# Patient Record
Sex: Female | Born: 1970 | Race: White | Hispanic: No | Marital: Married | Smoking: Never smoker
Health system: Southern US, Community
[De-identification: ages and names within clinical notes are randomized; demographics above are authoritative.]

## PROBLEM LIST (undated history)

## (undated) DIAGNOSIS — J45909 Unspecified asthma, uncomplicated: Secondary | ICD-10-CM

## (undated) DIAGNOSIS — N857 Hematometra: Secondary | ICD-10-CM

## (undated) DIAGNOSIS — T4145XA Adverse effect of unspecified anesthetic, initial encounter: Secondary | ICD-10-CM

## (undated) DIAGNOSIS — R112 Nausea with vomiting, unspecified: Secondary | ICD-10-CM

## (undated) DIAGNOSIS — K219 Gastro-esophageal reflux disease without esophagitis: Secondary | ICD-10-CM

## (undated) DIAGNOSIS — K2100 Gastro-esophageal reflux disease with esophagitis, without bleeding: Secondary | ICD-10-CM

## (undated) DIAGNOSIS — F419 Anxiety disorder, unspecified: Secondary | ICD-10-CM

## (undated) DIAGNOSIS — J189 Pneumonia, unspecified organism: Secondary | ICD-10-CM

## (undated) DIAGNOSIS — Z8709 Personal history of other diseases of the respiratory system: Secondary | ICD-10-CM

## (undated) DIAGNOSIS — D510 Vitamin B12 deficiency anemia due to intrinsic factor deficiency: Secondary | ICD-10-CM

## (undated) DIAGNOSIS — T8859XA Other complications of anesthesia, initial encounter: Secondary | ICD-10-CM

## (undated) HISTORY — PX: OOPHORECTOMY: SHX86

## (undated) HISTORY — PX: WISDOM TOOTH EXTRACTION: SHX21

## (undated) HISTORY — PX: ABDOMINAL HYSTERECTOMY: SHX81

---

## 2006-08-24 ENCOUNTER — Ambulatory Visit: Payer: Self-pay | Admitting: Unknown Physician Specialty

## 2007-01-15 ENCOUNTER — Ambulatory Visit: Payer: Self-pay | Admitting: Internal Medicine

## 2010-02-23 ENCOUNTER — Ambulatory Visit: Payer: Self-pay | Admitting: Gynecologic Oncology

## 2010-03-19 ENCOUNTER — Ambulatory Visit: Payer: Self-pay | Admitting: Gynecologic Oncology

## 2010-03-26 ENCOUNTER — Ambulatory Visit: Payer: Self-pay | Admitting: Gynecologic Oncology

## 2010-04-15 ENCOUNTER — Ambulatory Visit: Payer: Self-pay | Admitting: Gynecologic Oncology

## 2010-04-23 ENCOUNTER — Ambulatory Visit: Payer: Self-pay | Admitting: Gynecologic Oncology

## 2010-04-30 ENCOUNTER — Ambulatory Visit: Payer: Self-pay | Admitting: Gynecologic Oncology

## 2010-05-26 ENCOUNTER — Ambulatory Visit: Payer: Self-pay | Admitting: Gynecologic Oncology

## 2010-09-10 ENCOUNTER — Ambulatory Visit: Payer: Self-pay | Admitting: Internal Medicine

## 2011-05-27 HISTORY — PX: COLPOSCOPY: SHX161

## 2012-05-26 HISTORY — PX: CERVICAL CONE BIOPSY: SUR198

## 2013-08-04 ENCOUNTER — Ambulatory Visit: Payer: Self-pay | Admitting: Internal Medicine

## 2014-06-20 ENCOUNTER — Ambulatory Visit (INDEPENDENT_AMBULATORY_CARE_PROVIDER_SITE_OTHER): Payer: No Typology Code available for payment source

## 2014-06-20 ENCOUNTER — Encounter: Payer: Self-pay | Admitting: Podiatry

## 2014-06-20 ENCOUNTER — Ambulatory Visit (INDEPENDENT_AMBULATORY_CARE_PROVIDER_SITE_OTHER): Payer: No Typology Code available for payment source | Admitting: Podiatry

## 2014-06-20 VITALS — BP 137/98 | HR 87 | Resp 16 | Ht 69.0 in | Wt 215.0 lb

## 2014-06-20 DIAGNOSIS — L923 Foreign body granuloma of the skin and subcutaneous tissue: Secondary | ICD-10-CM

## 2014-06-20 DIAGNOSIS — M6740 Ganglion, unspecified site: Secondary | ICD-10-CM

## 2014-06-20 NOTE — Progress Notes (Signed)
   Subjective:    Patient ID: Julie Sharp, female    DOB: [birth year], 44 y.o.   MRN: [id number]  HPI Comments: i have a knot on the top of my rt big toe. Ive had it for 6 months and worse the last 3 weeks. Certain shoes will bother me. Seams of socks will bother it. Pressure will bother it. i have done nothing for my toe.   Foot Pain      Review of Systems  All other systems reviewed and are negative.      Objective:   Physical Exam        Assessment & Plan:

## 2014-06-21 NOTE — Progress Notes (Signed)
Subjective:     Patient ID: Julie Sharp, female   DOB: [birth year], 44 y.o.   MRN: [id number]  HPI patient presents stating I have this nodule on my right big toe that's been present for about 6 months and is becoming gradually more tender and makes it hard for me to wear certain shoes   Review of Systems  All other systems reviewed and are negative.      Objective:   Physical Exam  Constitutional: She is oriented to person, place, and time.  Cardiovascular: Intact distal pulses.   Musculoskeletal: Normal range of motion.  Neurological: She is oriented to person, place, and time.  Skin: Skin is warm.  Vitals reviewed.  neurovascular status intact with muscle strength adequate and range of motion within normal limits. Patient is noted to have a small nodule at the interphalangeal joint of the right big toe medial side that's painful when pressed and making shoe gear difficult. No other significant pathology with digits well perfused and patient well oriented 3     Assessment:     Interphalangeal joint cyst formation or possible small inflammatory capsulitis right side with possibility for bone spur    Plan:     H&P and x-ray reviewed. Did a careful injection underneath the small lesion 3 mg [place] some Kenalog 5 mg Xylocaine and applied padding with cushioned. If symptoms persist reappoint

## 2015-03-29 ENCOUNTER — Other Ambulatory Visit: Payer: Self-pay | Admitting: Unknown Physician Specialty

## 2015-03-29 ENCOUNTER — Inpatient Hospital Stay
Admission: RE | Admit: 2015-03-29 | Discharge: 2015-03-29 | Disposition: A | Discharge status: Home / Self Care | Payer: Self-pay | Source: Ambulatory Visit | Attending: *Deleted | Admitting: *Deleted

## 2015-03-29 ENCOUNTER — Other Ambulatory Visit: Payer: Self-pay | Admitting: *Deleted

## 2015-03-29 DIAGNOSIS — R928 Other abnormal and inconclusive findings on diagnostic imaging of breast: Secondary | ICD-10-CM

## 2015-03-29 DIAGNOSIS — Z9289 Personal history of other medical treatment: Secondary | ICD-10-CM

## 2015-03-30 ENCOUNTER — Ambulatory Visit
Admission: RE | Admit: 2015-03-30 | Discharge: 2015-03-30 | Disposition: A | Discharge status: Home / Self Care | Payer: 59 | Source: Ambulatory Visit | Attending: Unknown Physician Specialty | Admitting: Unknown Physician Specialty

## 2015-03-30 DIAGNOSIS — R928 Other abnormal and inconclusive findings on diagnostic imaging of breast: Secondary | ICD-10-CM

## 2016-06-10 DIAGNOSIS — J40 Bronchitis, not specified as acute or chronic: Secondary | ICD-10-CM | POA: Diagnosis not present

## 2016-07-15 ENCOUNTER — Other Ambulatory Visit: Payer: Self-pay | Admitting: Obstetrics and Gynecology

## 2016-07-15 DIAGNOSIS — Z1231 Encounter for screening mammogram for malignant neoplasm of breast: Secondary | ICD-10-CM

## 2016-08-04 ENCOUNTER — Encounter: Payer: Self-pay | Admitting: Obstetrics and Gynecology

## 2016-08-04 ENCOUNTER — Ambulatory Visit
Admission: RE | Admit: 2016-08-04 | Discharge: 2016-08-04 | Disposition: A | Discharge status: Home / Self Care | Payer: 59 | Source: Ambulatory Visit | Attending: Obstetrics and Gynecology | Admitting: Obstetrics and Gynecology

## 2016-08-04 ENCOUNTER — Ambulatory Visit: Admission: RE | Admit: 2016-08-04 | Payer: 59 | Source: Ambulatory Visit

## 2016-08-04 DIAGNOSIS — Z1231 Encounter for screening mammogram for malignant neoplasm of breast: Secondary | ICD-10-CM | POA: Insufficient documentation

## 2016-08-06 DIAGNOSIS — D230 Other benign neoplasm of skin of lip: Secondary | ICD-10-CM | POA: Diagnosis not present

## 2016-09-16 DIAGNOSIS — R5383 Other fatigue: Secondary | ICD-10-CM | POA: Diagnosis not present

## 2016-10-07 DIAGNOSIS — R5383 Other fatigue: Secondary | ICD-10-CM | POA: Diagnosis not present

## 2016-12-01 DIAGNOSIS — J029 Acute pharyngitis, unspecified: Secondary | ICD-10-CM | POA: Diagnosis not present

## 2017-01-15 ENCOUNTER — Other Ambulatory Visit: Payer: Self-pay | Admitting: Physician Assistant

## 2017-01-15 ENCOUNTER — Telehealth: Payer: Self-pay

## 2017-01-15 DIAGNOSIS — R1032 Left lower quadrant pain: Principal | ICD-10-CM

## 2017-01-15 DIAGNOSIS — R1031 Right lower quadrant pain: Secondary | ICD-10-CM

## 2017-01-15 DIAGNOSIS — R399 Unspecified symptoms and signs involving the genitourinary system: Secondary | ICD-10-CM | POA: Diagnosis not present

## 2017-01-15 DIAGNOSIS — R102 Pelvic and perineal pain: Secondary | ICD-10-CM | POA: Diagnosis not present

## 2017-01-15 NOTE — Telephone Encounter (Signed)
Called and schedule pain for 01/19/17 with Opal Sidles. Pt called back to cancel

## 2017-01-15 NOTE — Telephone Encounter (Signed)
Pt is have issues and isn't sure who to call/see.  She has seen her PCP who is treating her for a UTI c an antibx but isn't any better.  Sxs started last Thurs c a lot of pressure very low in abd area, bloated, had severe cramping Thurs thru weekend, now very tender.  It almost feels like she is constipated but she is having bowel movements.  Abd is sore and tender.  [phone]

## 2017-01-15 NOTE — Telephone Encounter (Signed)
Please schedule an appointment with any provider for pelvic pain. She has not been seen since 03/2016. Does not need to be a work-in. Thanks

## 2017-01-16 ENCOUNTER — Ambulatory Visit
Admission: RE | Admit: 2017-01-16 | Discharge: 2017-01-16 | Disposition: A | Discharge status: Home / Self Care | Payer: 59 | Source: Ambulatory Visit | Attending: Physician Assistant | Admitting: Physician Assistant

## 2017-01-16 DIAGNOSIS — R1032 Left lower quadrant pain: Secondary | ICD-10-CM | POA: Diagnosis not present

## 2017-01-16 DIAGNOSIS — K439 Ventral hernia without obstruction or gangrene: Secondary | ICD-10-CM | POA: Diagnosis not present

## 2017-01-16 DIAGNOSIS — R938 Abnormal findings on diagnostic imaging of other specified body structures: Secondary | ICD-10-CM | POA: Insufficient documentation

## 2017-01-16 DIAGNOSIS — N882 Stricture and stenosis of cervix uteri: Secondary | ICD-10-CM | POA: Diagnosis not present

## 2017-01-16 DIAGNOSIS — R1031 Right lower quadrant pain: Secondary | ICD-10-CM | POA: Diagnosis present

## 2017-01-16 DIAGNOSIS — R109 Unspecified abdominal pain: Secondary | ICD-10-CM | POA: Diagnosis not present

## 2017-01-16 DIAGNOSIS — N857 Hematometra: Secondary | ICD-10-CM | POA: Diagnosis not present

## 2017-01-16 MED ORDER — IOPAMIDOL (ISOVUE-300) INJECTION 61%
100.0000 mL | Freq: Once | INTRAVENOUS | Status: AC | PRN
  Administered 2017-01-16: 100 mL via INTRAVENOUS

## 2017-01-19 ENCOUNTER — Ambulatory Visit: Payer: No Typology Code available for payment source | Admitting: Advanced Practice Midwife

## 2017-02-03 DIAGNOSIS — N882 Stricture and stenosis of cervix uteri: Secondary | ICD-10-CM | POA: Diagnosis not present

## 2017-03-06 DIAGNOSIS — N882 Stricture and stenosis of cervix uteri: Secondary | ICD-10-CM | POA: Diagnosis not present

## 2017-03-26 DIAGNOSIS — N857 Hematometra: Secondary | ICD-10-CM

## 2017-03-26 HISTORY — DX: Hematometra: N85.7

## 2017-04-23 ENCOUNTER — Other Ambulatory Visit: Payer: Self-pay

## 2017-04-23 ENCOUNTER — Encounter
Admission: RE | Admit: 2017-04-23 | Discharge: 2017-04-23 | Disposition: A | Discharge status: Home / Self Care | Payer: 59 | Source: Ambulatory Visit | Attending: Obstetrics & Gynecology | Admitting: Obstetrics & Gynecology

## 2017-04-23 DIAGNOSIS — N938 Other specified abnormal uterine and vaginal bleeding: Secondary | ICD-10-CM | POA: Diagnosis not present

## 2017-04-23 DIAGNOSIS — N857 Hematometra: Secondary | ICD-10-CM | POA: Diagnosis not present

## 2017-04-23 DIAGNOSIS — Z01818 Encounter for other preprocedural examination: Secondary | ICD-10-CM | POA: Insufficient documentation

## 2017-04-23 DIAGNOSIS — N882 Stricture and stenosis of cervix uteri: Secondary | ICD-10-CM | POA: Insufficient documentation

## 2017-04-23 HISTORY — DX: Adverse effect of unspecified anesthetic, initial encounter: T41.45XA

## 2017-04-23 HISTORY — DX: Other complications of anesthesia, initial encounter: T88.59XA

## 2017-04-23 HISTORY — DX: Anxiety disorder, unspecified: F41.9

## 2017-04-23 HISTORY — DX: Hematometra: N85.7

## 2017-04-23 LAB — BASIC METABOLIC PANEL
Anion gap: 6 (ref 5–15)
BUN: 10 mg/dL (ref 6–20)
CHLORIDE: 105 mmol/L (ref 101–111)
CO2: 28 mmol/L (ref 22–32)
CREATININE: 0.6 mg/dL (ref 0.44–1.00)
Calcium: 9.2 mg/dL (ref 8.9–10.3)
GFR calc Af Amer: >60 mL/min (ref >60)
GFR calc non Af Amer: >60 mL/min (ref >60)
GLUCOSE: 81 mg/dL (ref 65–99)
POTASSIUM: 3.6 mmol/L (ref 3.5–5.1)
Sodium: 139 mmol/L (ref 135–145)

## 2017-04-23 LAB — TYPE AND SCREEN
ABO/RH(D): AB POS
ANTIBODY SCREEN: NEGATIVE

## 2017-04-23 LAB — CBC
HEMATOCRIT: 41.4 % (ref 35.0–47.0)
Hemoglobin: 13.9 g/dL (ref 12.0–16.0)
MCH: 30.2 pg (ref 26.0–34.0)
MCHC: 33.7 g/dL (ref 32.0–36.0)
MCV: 89.7 fL (ref 80.0–100.0)
Platelets: 230 10*3/uL (ref 150–440)
RBC: 4.61 MIL/uL (ref 3.80–5.20)
RDW: 12.6 % (ref 11.5–14.5)
WBC: 6.6 10*3/uL (ref 3.6–11.0)

## 2017-04-23 NOTE — H&P (Signed)
Chief Complaint:    Patient ID: Julie Sharp is a 46 y.o. female presenting with Pre Op Consulting  on 04/23/2017  HPI: Julie Sharp presents for preop exam for hysterectomy.  She has a history of persistent cervical stenosis and hematometra.  She had had to have her cervix dilated manually three times in the last year with copious blood extracted from the uterus.  A Liletta IUD was placed, with decrease in bleeding, but still the cervix closes and there is retention of blood.  This causes her significant pain and pressure and she has elected for removal of the organ.  EMB showed no carcinoma or hyperplasia Last pap, 2017 or 2016 was negative/negative    Past Medical History:  has no past medical history on file.  Past Surgical History:  has a past surgical history that includes Cesarean section (2001/2004) and Colposcopy. Family History: family history includes High blood pressure (Hypertension) in her father and mother; Hyperlipidemia (Elevated cholesterol) in her mother. Social History:  reports that she has never smoked. She has never used smokeless tobacco. She reports that she does not drink alcohol or use drugs. OB/GYN History:          OB History    Gravida  2   Para  2   Term  2   Preterm      AB      Living  2     SAB      TAB      Ectopic      Molar      Multiple      Live Births  2          Allergies: has No Known Allergies. Medications:  Current Outpatient Medications:  .  levonorgestrel (LILETTA IU), Insert into the uterus., Disp: , Rfl:  .  sertraline (ZOLOFT) 50 MG tablet, Take 1 tablet (50 mg total) by mouth once daily, Disp: 90 tablet, Rfl: 3 .  methylergonovine (METHERGINE) 0.2 mg tablet, Take 1 tablet (0.2 mg total) by mouth 4 (four) times daily. (Patient not taking: Reported on 03/06/2017 ), Disp: 8 tablet, Rfl: 0   Review of Systems: No SOB, no palpitations or chest pain, no new lower extremity edema, no nausea or  vomiting or bowel or bladder complaints. See HPI for gyn specific ROS.   Exam:     Constitutional: BP (!) 145/94   Pulse 78   Ht 172.7 cm (5\' 8" )   Wt 98.9 kg (218 lb)   BMI 33.15 kg/m   Body mass index is 33.15 kg/m. WDWN white female in NAD   HEENT: sclera clear, non-icteric, moist mucous membranes, dentition intact Endocrine:  no thyromegaly Respiratory: normal respiratory effort, CTABL    CV: no peripheral edema, RRR GI: soft , no mass, non-tender, no rebound tenderness GU: tanner stage 5 ,              External genitalia/skin: vulva /labia no lesions             Lymphatic: no enlarged inguinal nodes bilaterally             Urethra: no prolapse, no diverticulum, no caruncle             Bladder: no tenderness to palpation, no cystocele             Vagina: normal physiologic d/c, no lesions, normal apical support             Cervix: no lesions, no cervical  motion tenderness, IUD strings NOT visible, stenotic               Uterus: normal size shape and contour, non-tender, mobile             Adnexa: no masses bilaterally, non-tender   Skin: warm and well perfused, no rashes Neuro: alert, oriented x3,   Psych: appropriate mood and insight, judgement intact   Impression:   The primary encounter diagnosis was Preoperative exam for gynecologic surgery. Diagnoses of Cervical os stenosis and Hematometra were also pertinent to this visit.    Plan:    Cervix dilated and hematometra expressed  The patient and I discussed the technical aspects of the procedure including the potential for risks and complications.These include but are not limited to the risk of infection requiring post-operative antibiotics or further procedures.We talked about the risk of injury to adjacent organs including bladder, bowel, ureter, blood vessels or nerves, the need to convert to an open incision, possibleneed for blood transfusion andpostop complications such asthromboembolic or  cardiopulmonary complications.All of her questions were answered. Her preoperative exam was completed andthe appropriate consents were signed. She is scheduled to undergo this procedure in the near future.  Planned surgery:  TLH BS  ----- Larey Days, MD Attending Obstetrician and Gynecologist [facility], Department of [facility]

## 2017-04-23 NOTE — Patient Instructions (Signed)
Your procedure is scheduled on: May 01, 2017  Report to [place]  To find out your arrival time please call [phone] between 1PM - 3PM on Thursday, April 30, 2017  Remember: Instructions that are not followed completely may result in serious medical risk, up to and including death, or upon the discretion of your surgeon and anesthesiologist your surgery may need to be rescheduled.     _X__ 1. Do not eat food after midnight the night before your procedure.                 No gum chewing or hard candies. You may drink clear liquids up to 2 hours                 before you are scheduled to arrive for your surgery- DO not drink clear                 liquids within 2 hours of the start of your surgery.                 Clear Liquids include:  water, apple juice without pulp, clear carbohydrate                 drink such as Clearfast of [place], Black Coffee or Tea (Do not add                 anything to coffee or tea).     _X__ 2.  No Alcohol for 24 hours before or after surgery.   _X__ 3.  Do Not Smoke or use e-cigarettes For 24 Hours Prior to Your Surgery.                 Do not use any chewable tobacco products for at least 6 hours prior to                 surgery.  ____  4.  Bring all medications with you on the day of surgery if instructed.   __x__  5.  Notify your doctor if there is any change in your medical condition      (cold, fever, infections).     Do not wear jewelry, make-up, hairpins, clips or nail polish. Do not wear lotions, powders, or perfumes. You may wear deodorant. Do not shave 48 hours prior to surgery. Men may shave face and neck. Do not bring valuables to the hospital.    [facility] is not responsible for any belongings or valuables.  Contacts, dentures or bridgework may not be worn into surgery. Leave your suitcase in the car. After surgery it may be brought to your room. For patients admitted to the  hospital, discharge time is determined by your treatment team.   Patients discharged the day of surgery will not be allowed to drive home.   Please read over the following fact sheets that you were given:   PREPARING FOR SURGERY   ____ Take these medicines the morning of surgery with A SIP OF WATER:    1. Zoloft  2.   3.   4.  5.  6.  ____ Fleet Enema (as directed)   __x__ Use CHG Soap as directed  ____ Use inhalers on the day of surgery  ____ Stop Anti-inflammatories as of today.  This includes ibuprofen/motrin/aleve.   Make sure to have stool softeners for when you get home.

## 2017-04-30 MED ORDER — CEFAZOLIN SODIUM-DEXTROSE 2-4 GM/100ML-% IV SOLN
2.0000 g | Freq: Once | INTRAVENOUS | Status: AC
  Administered 2017-05-01: 2 g via INTRAVENOUS

## 2017-05-01 ENCOUNTER — Inpatient Hospital Stay: Payer: 59 | Admitting: Anesthesiology

## 2017-05-01 ENCOUNTER — Encounter: Payer: Self-pay | Admitting: *Deleted

## 2017-05-01 ENCOUNTER — Ambulatory Visit
Admission: RE | Admit: 2017-05-01 | Discharge: 2017-05-01 | Disposition: A | Discharge status: Home / Self Care | Payer: 59 | Source: Ambulatory Visit | Attending: Obstetrics & Gynecology | Admitting: Obstetrics & Gynecology

## 2017-05-01 ENCOUNTER — Encounter
Admission: RE | Disposition: A | Discharge status: Home / Self Care | Payer: Self-pay | Source: Ambulatory Visit | Attending: Obstetrics & Gynecology

## 2017-05-01 DIAGNOSIS — N882 Stricture and stenosis of cervix uteri: Secondary | ICD-10-CM | POA: Insufficient documentation

## 2017-05-01 DIAGNOSIS — N938 Other specified abnormal uterine and vaginal bleeding: Secondary | ICD-10-CM | POA: Insufficient documentation

## 2017-05-01 DIAGNOSIS — F419 Anxiety disorder, unspecified: Secondary | ICD-10-CM | POA: Insufficient documentation

## 2017-05-01 DIAGNOSIS — Z6833 Body mass index (BMI) 33.0-33.9, adult: Secondary | ICD-10-CM | POA: Diagnosis not present

## 2017-05-01 DIAGNOSIS — E669 Obesity, unspecified: Secondary | ICD-10-CM | POA: Insufficient documentation

## 2017-05-01 DIAGNOSIS — N838 Other noninflammatory disorders of ovary, fallopian tube and broad ligament: Secondary | ICD-10-CM | POA: Insufficient documentation

## 2017-05-01 DIAGNOSIS — N72 Inflammatory disease of cervix uteri: Secondary | ICD-10-CM | POA: Insufficient documentation

## 2017-05-01 DIAGNOSIS — Z8249 Family history of ischemic heart disease and other diseases of the circulatory system: Secondary | ICD-10-CM | POA: Insufficient documentation

## 2017-05-01 DIAGNOSIS — Z79899 Other long term (current) drug therapy: Secondary | ICD-10-CM | POA: Insufficient documentation

## 2017-05-01 DIAGNOSIS — N857 Hematometra: Secondary | ICD-10-CM | POA: Insufficient documentation

## 2017-05-01 DIAGNOSIS — N879 Dysplasia of cervix uteri, unspecified: Secondary | ICD-10-CM | POA: Diagnosis not present

## 2017-05-01 HISTORY — PX: LAPAROSCOPIC UNILATERAL SALPINGECTOMY: SHX5934

## 2017-05-01 HISTORY — PX: LAPAROSCOPIC SALPINGO OOPHERECTOMY: SHX5927

## 2017-05-01 HISTORY — PX: LAPAROSCOPIC HYSTERECTOMY: SHX1926

## 2017-05-01 LAB — POCT PREGNANCY, URINE: PREG TEST UR: NEGATIVE

## 2017-05-01 SURGERY — HYSTERECTOMY, TOTAL, LAPAROSCOPIC
Anesthesia: General | Laterality: Right

## 2017-05-01 MED ORDER — FENTANYL CITRATE (PF) 100 MCG/2ML IJ SOLN
25.0000 ug | INTRAMUSCULAR | Status: DC | PRN
  Administered 2017-05-01 (×2): 50 ug via INTRAVENOUS

## 2017-05-01 MED ORDER — OXYCODONE HCL 5 MG PO TABS: 5.0000 mg | ORAL_TABLET | ORAL | 0 refills | Status: DC | PRN

## 2017-05-01 MED ORDER — KETOROLAC TROMETHAMINE 30 MG/ML IJ SOLN
30.0000 mg | Freq: Four times a day (QID) | INTRAMUSCULAR | Status: DC
  Administered 2017-05-01: 30 mg via INTRAVENOUS

## 2017-05-01 MED ORDER — ONDANSETRON HCL 4 MG/2ML IJ SOLN
INTRAMUSCULAR | Status: DC | PRN
  Administered 2017-05-01 (×2): 4 mg via INTRAVENOUS

## 2017-05-01 MED ORDER — BUPIVACAINE HCL (PF) 0.5 % IJ SOLN
INTRAMUSCULAR | Status: DC | PRN
  Administered 2017-05-01: 46 mL

## 2017-05-01 MED ORDER — SUGAMMADEX SODIUM 200 MG/2ML IV SOLN
INTRAVENOUS | Status: DC | PRN
  Administered 2017-05-01: 197.8 mg via INTRAVENOUS

## 2017-05-01 MED ORDER — OXYCODONE HCL 5 MG PO TABS
5.0000 mg | ORAL_TABLET | Freq: Once | ORAL | Status: AC | PRN
  Administered 2017-05-01: 5 mg via ORAL

## 2017-05-01 MED ORDER — BUPIVACAINE HCL (PF) 0.5 % IJ SOLN
INTRAMUSCULAR | Status: AC
  Filled 2017-05-01: qty 30

## 2017-05-01 MED ORDER — ACETAMINOPHEN 500 MG PO TABS
ORAL_TABLET | ORAL | Status: AC
  Administered 2017-05-01: 1000 mg
  Filled 2017-05-01: qty 2

## 2017-05-01 MED ORDER — MEPERIDINE HCL 50 MG/ML IJ SOLN: 6.2500 mg | INTRAMUSCULAR | Status: DC | PRN

## 2017-05-01 MED ORDER — IBUPROFEN 200 MG PO TABS: 600.0000 mg | ORAL_TABLET | Freq: Four times a day (QID) | ORAL | 0 refills | Status: DC | PRN

## 2017-05-01 MED ORDER — ROCURONIUM BROMIDE 100 MG/10ML IV SOLN
INTRAVENOUS | Status: DC | PRN
  Administered 2017-05-01: 20 mg via INTRAVENOUS
  Administered 2017-05-01: 10 mg via INTRAVENOUS
  Administered 2017-05-01: 40 mg via INTRAVENOUS

## 2017-05-01 MED ORDER — OXYCODONE HCL 5 MG PO TABS
ORAL_TABLET | ORAL | Status: AC
  Filled 2017-05-01: qty 1

## 2017-05-01 MED ORDER — OXYCODONE HCL 5 MG/5ML PO SOLN: 5.0000 mg | Freq: Once | ORAL | Status: AC | PRN

## 2017-05-01 MED ORDER — LIDOCAINE HCL (CARDIAC) 20 MG/ML IV SOLN
INTRAVENOUS | Status: DC | PRN
  Administered 2017-05-01: 100 mg via INTRAVENOUS

## 2017-05-01 MED ORDER — FAMOTIDINE 20 MG PO TABS
ORAL_TABLET | ORAL | Status: AC
  Administered 2017-05-01: 20 mg
  Filled 2017-05-01: qty 1

## 2017-05-01 MED ORDER — PHENYLEPHRINE HCL 10 MG/ML IJ SOLN
INTRAMUSCULAR | Status: DC | PRN
  Administered 2017-05-01 (×3): 100 ug via INTRAVENOUS

## 2017-05-01 MED ORDER — PROPOFOL 10 MG/ML IV BOLUS
INTRAVENOUS | Status: AC
  Filled 2017-05-01: qty 20

## 2017-05-01 MED ORDER — SUCCINYLCHOLINE CHLORIDE 20 MG/ML IJ SOLN
INTRAMUSCULAR | Status: DC | PRN
  Administered 2017-05-01: 100 mg via INTRAVENOUS

## 2017-05-01 MED ORDER — SEVOFLURANE IN SOLN
RESPIRATORY_TRACT | Status: AC
Start: 2017-05-01 — End: 2017-05-01
  Filled 2017-05-01: qty 250

## 2017-05-01 MED ORDER — HEPARIN SODIUM (PORCINE) 5000 UNIT/ML IJ SOLN
INTRAMUSCULAR | Status: AC
  Administered 2017-05-01: 5000 [IU]
  Filled 2017-05-01: qty 1

## 2017-05-01 MED ORDER — KETOROLAC TROMETHAMINE 30 MG/ML IJ SOLN
INTRAMUSCULAR | Status: AC
  Administered 2017-05-01: 30 mg via INTRAVENOUS
  Filled 2017-05-01: qty 1

## 2017-05-01 MED ORDER — FENTANYL CITRATE (PF) 100 MCG/2ML IJ SOLN
INTRAMUSCULAR | Status: AC
  Administered 2017-05-01: 50 ug via INTRAVENOUS
  Filled 2017-05-01: qty 2

## 2017-05-01 MED ORDER — DEXAMETHASONE SODIUM PHOSPHATE 10 MG/ML IJ SOLN
INTRAMUSCULAR | Status: DC | PRN
  Administered 2017-05-01: 10 mg via INTRAVENOUS

## 2017-05-01 MED ORDER — GABAPENTIN 300 MG PO CAPS
ORAL_CAPSULE | ORAL | Status: AC
  Administered 2017-05-01: 600 mg
  Filled 2017-05-01: qty 2

## 2017-05-01 MED ORDER — BUPIVACAINE LIPOSOME 1.3 % IJ SUSP
INTRAMUSCULAR | Status: AC
  Filled 2017-05-01: qty 20

## 2017-05-01 MED ORDER — MIDAZOLAM HCL 2 MG/2ML IJ SOLN
INTRAMUSCULAR | Status: DC | PRN
  Administered 2017-05-01: 2 mg via INTRAVENOUS

## 2017-05-01 MED ORDER — FENTANYL CITRATE (PF) 250 MCG/5ML IJ SOLN
INTRAMUSCULAR | Status: AC
  Filled 2017-05-01: qty 5

## 2017-05-01 MED ORDER — LACTATED RINGERS IV SOLN
INTRAVENOUS | Status: DC
  Administered 2017-05-01: 11:00:00 via INTRAVENOUS
  Administered 2017-05-01: 100 mL/h via INTRAVENOUS

## 2017-05-01 MED ORDER — MIDAZOLAM HCL 2 MG/2ML IJ SOLN
INTRAMUSCULAR | Status: AC
  Filled 2017-05-01: qty 2

## 2017-05-01 MED ORDER — CELECOXIB 200 MG PO CAPS
ORAL_CAPSULE | ORAL | Status: AC
  Administered 2017-05-01: 400 mg
  Filled 2017-05-01: qty 2

## 2017-05-01 MED ORDER — PROMETHAZINE HCL 25 MG/ML IJ SOLN: 6.2500 mg | INTRAMUSCULAR | Status: DC | PRN

## 2017-05-01 MED ORDER — PROPOFOL 10 MG/ML IV BOLUS
INTRAVENOUS | Status: DC | PRN
  Administered 2017-05-01: 150 mg via INTRAVENOUS

## 2017-05-01 MED ORDER — CEFAZOLIN SODIUM-DEXTROSE 2-4 GM/100ML-% IV SOLN
INTRAVENOUS | Status: AC
  Filled 2017-05-01: qty 100

## 2017-05-01 MED ORDER — FENTANYL CITRATE (PF) 100 MCG/2ML IJ SOLN
INTRAMUSCULAR | Status: DC | PRN
  Administered 2017-05-01: 50 ug via INTRAVENOUS
  Administered 2017-05-01: 100 ug via INTRAVENOUS
  Administered 2017-05-01: 50 ug via INTRAVENOUS

## 2017-05-01 SURGICAL SUPPLY — 55 items
BACTOSHIELD CHG 4% 4OZ (MISCELLANEOUS) ×1
BAG URINE DRAINAGE (UROLOGICAL SUPPLIES) ×6 IMPLANT
BLADE SURG SZ11 CARB STEEL (BLADE) ×6 IMPLANT
CANISTER SUCT 1200ML W/VALVE (MISCELLANEOUS) ×6 IMPLANT
CATH FOLEY 2WAY  5CC 16FR (CATHETERS) ×2
CATH URTH 16FR FL 2W BLN LF (CATHETERS) ×4 IMPLANT
CHLORAPREP W/TINT 26ML (MISCELLANEOUS) ×6 IMPLANT
DEFOGGER SCOPE WARMER CLEARIFY (MISCELLANEOUS) ×6 IMPLANT
DERMABOND ADVANCED (GAUZE/BANDAGES/DRESSINGS) ×2
DERMABOND ADVANCED .7 DNX12 (GAUZE/BANDAGES/DRESSINGS) ×4 IMPLANT
DEVICE SUTURE ENDOST 10MM (ENDOMECHANICALS) ×6 IMPLANT
DEVICE TROCAR PUNCTURE CLOSURE (ENDOMECHANICALS) ×6 IMPLANT
DRAPE LEGGINS SURG 28X43 STRL (DRAPES) ×6 IMPLANT
DRAPE SHEET LG 3/4 BI-LAMINATE (DRAPES) ×6 IMPLANT
DRAPE UNDER BUTTOCK W/FLU (DRAPES) ×6 IMPLANT
ELECT REM PT RETURN 9FT ADLT (ELECTROSURGICAL) ×6
ELECTRODE REM PT RTRN 9FT ADLT (ELECTROSURGICAL) ×4 IMPLANT
GLOVE PI ORTHOPRO 6.5 (GLOVE) ×2
GLOVE PI ORTHOPRO STRL 6.5 (GLOVE) ×4 IMPLANT
GLOVE SURG SYN 6.5 ES PF (GLOVE) ×18 IMPLANT
GOWN STRL REUS W/ TWL LRG LVL3 (GOWN DISPOSABLE) ×12 IMPLANT
GOWN STRL REUS W/ TWL XL LVL3 (GOWN DISPOSABLE) ×4 IMPLANT
GOWN STRL REUS W/TWL LRG LVL3 (GOWN DISPOSABLE) ×6
GOWN STRL REUS W/TWL XL LVL3 (GOWN DISPOSABLE) ×2
IRRIGATION STRYKERFLOW (MISCELLANEOUS) ×4 IMPLANT
IRRIGATOR STRYKERFLOW (MISCELLANEOUS) ×6
IV LACTATED RINGERS 1000ML (IV SOLUTION) ×6 IMPLANT
KIT PINK PAD W/HEAD ARE REST (MISCELLANEOUS) ×6
KIT PINK PAD W/HEAD ARM REST (MISCELLANEOUS) ×4 IMPLANT
KIT RM TURNOVER CYSTO AR (KITS) ×6 IMPLANT
L-HOOK LAP DISP 36CM (ELECTROSURGICAL) ×6
LABEL OR SOLS (LABEL) IMPLANT
LHOOK LAP DISP 36CM (ELECTROSURGICAL) ×4 IMPLANT
LIGASURE VESSEL 5MM BLUNT TIP (ELECTROSURGICAL) ×6 IMPLANT
MANIPULATOR VCARE LG CRV RETR (MISCELLANEOUS) IMPLANT
MANIPULATOR VCARE SML CRV RETR (MISCELLANEOUS) IMPLANT
MANIPULATOR VCARE STD CRV RETR (MISCELLANEOUS) ×6 IMPLANT
NS IRRIG 500ML POUR BTL (IV SOLUTION) ×6 IMPLANT
PACK LAP CHOLECYSTECTOMY (MISCELLANEOUS) ×6 IMPLANT
PAD OB MATERNITY 4.3X12.25 (PERSONAL CARE ITEMS) ×6 IMPLANT
PAD PREP 24X41 OB/GYN DISP (PERSONAL CARE ITEMS) ×6 IMPLANT
PENCIL ELECTRO HAND CTR (MISCELLANEOUS) ×6 IMPLANT
SCRUB CHG 4% DYNA-HEX 4OZ (MISCELLANEOUS) ×5 IMPLANT
SET CYSTO W/LG BORE CLAMP LF (SET/KITS/TRAYS/PACK) IMPLANT
SET YANKAUER POOLE SUCT (MISCELLANEOUS) ×6 IMPLANT
SLEEVE ENDOPATH XCEL 5M (ENDOMECHANICALS) ×6 IMPLANT
SURGILUBE 2OZ TUBE FLIPTOP (MISCELLANEOUS) ×6 IMPLANT
SUT MNCRL 4-0 (SUTURE) ×2
SUT MNCRL 4-0 27XMFL (SUTURE) ×4
SUT MNCRL AB 4-0 PS2 18 (SUTURE) IMPLANT
SUT VIC AB 0 CT1 36 (SUTURE) ×12 IMPLANT
SUTURE MNCRL 4-0 27XMF (SUTURE) ×4 IMPLANT
TROCAR XCEL NON-BLD 5MMX100MML (ENDOMECHANICALS) ×6 IMPLANT
TUBING INSUF HEATED (TUBING) ×6 IMPLANT
TUBING INSUFFLATION (TUBING) ×6 IMPLANT

## 2017-05-01 NOTE — Anesthesia Post-op Follow-up Note (Signed)
Anesthesia QCDR form completed.        

## 2017-05-01 NOTE — Interval H&P Note (Signed)
History and Physical Interval Note:  05/01/2017 9:53 AM  [place] Evern Core  has presented today for surgery, with the diagnosis of AUB  Stenotic Cervix  The various methods of treatment have been discussed with the patient and family. After consideration of risks, benefits and other options for treatment, the patient has consented to  Procedure(s): HYSTERECTOMY TOTAL LAPAROSCOPIC (N/A) LAPAROSCOPIC BILATERAL SALPINGECTOMY (Bilateral) as a surgical intervention .  The patient's history has been reviewed, patient examined, no change in status, stable for surgery.  I have reviewed the patient's chart and labs.  Questions were answered to the patient's satisfaction.    BP 132/82   Pulse 89   Temp 98 F (36.7 C) (Oral)   Resp 14   Ht 5\' 8"  (1.727 m)   Wt 98.9 kg (218 lb)   SpO2 98%   BMI 33.15 kg/m    [facility] Bilan Tedesco

## 2017-05-01 NOTE — Anesthesia Preprocedure Evaluation (Signed)
Anesthesia Evaluation  Patient identified by MRN, date of birth, ID band Patient awake    Reviewed: Allergy & Precautions, NPO status , Patient's Chart, lab work & pertinent test results  History of Anesthesia Complications Negative for: history of anesthetic complications (hx of aspiration PNA after an anesthetic)  Airway Mallampati: III  TM Distance: >3 FB Neck ROM: Full    Dental no notable dental hx.    Pulmonary neg pulmonary ROS, neg sleep apnea, neg COPD,    breath sounds clear to auscultation- rhonchi (-) wheezing      Cardiovascular Exercise Tolerance: Good (-) hypertension(-) CAD and (-) Past MI  Rhythm:Regular Rate:Normal - Systolic murmurs and - Diastolic murmurs    Neuro/Psych Anxiety negative neurological ROS     GI/Hepatic negative GI ROS, Neg liver ROS,   Endo/Other  negative endocrine ROSneg diabetes  Renal/GU negative Renal ROS     Musculoskeletal negative musculoskeletal ROS (+)   Abdominal (+) + obese,   Peds  Hematology negative hematology ROS (+)   Anesthesia Other Findings Past Medical History: No date: Anxiety No date: Complication of anesthesia     Comment:  patient developed pneumonia after last surgery. unsure               if she aspirated during surgery. 03/2017: Hematometra   Reproductive/Obstetrics                             Anesthesia Physical Anesthesia Plan  ASA: II  Anesthesia Plan: General   Post-op Pain Management:    Induction: Intravenous  PONV Risk Score and Plan: 2 and Dexamethasone and Ondansetron  Airway Management Planned: Oral ETT  Additional Equipment:   Intra-op Plan:   Post-operative Plan: Extubation in OR  Informed Consent: I have reviewed the patients History and Physical, chart, labs and discussed the procedure including the risks, benefits and alternatives for the proposed anesthesia with the patient or authorized  representative who has indicated his/her understanding and acceptance.   Dental advisory given  Plan Discussed with: CRNA and Anesthesiologist  Anesthesia Plan Comments:         Anesthesia Quick Evaluation

## 2017-05-01 NOTE — Discharge Instructions (Signed)
Discharge instructions:  °Call office if you have any of the following: fever >101 F, chills, excessive vaginal bleeding, incision drainage or problems, leg pain or redness, or any other concerns.  ° °Activity: Do not lift > 10 lbs for 8 weeks.  °No intercourse or tampons for 8 weeks.  °No driving for 1-2 weeks.  ° °You may feel some pain in your upper right abdomen/rib and right shoulder.  This is from the gas in the abdomen for surgery. This will subside over time, please be patient! ° °Take 600mg Ibuprofen and 1000mg Tylenol around the clock, every 6 hours for at least the first 3-5 days.  After this you can take as needed.  This will help decrease inflammation and promote healing.  The narcotics you'll take just as needed, as they just trick your brain into thinking its not in pain.   ° °Please don't limit yourself in terms of routine activity.  You will be able to do most things, although they may take longer to do or be a little painful.  You can do it! ° °Don't be a hero, but don't be a wimp either!  ° ° °AMBULATORY SURGERY  °DISCHARGE INSTRUCTIONS ° ° °1) The drugs that you were given will stay in your system until tomorrow so for the next 24 hours you should not: ° °A) Drive an automobile °B) Make any legal decisions °C) Drink any alcoholic beverage ° ° °2) You may resume regular meals tomorrow.  Today it is better to start with liquids and gradually work up to solid foods. ° °You may eat anything you prefer, but it is better to start with liquids, then soup and crackers, and gradually work up to solid foods. ° ° °3) Please notify your doctor immediately if you have any unusual bleeding, trouble breathing, redness and pain at the surgery site, drainage, fever, or pain not relieved by medication. ° ° ° °4) Additional Instructions: ° ° ° ° ° ° ° °Please contact your physician with any problems or Same Day Surgery at [phone], Monday through Friday 6 am to 4 pm, or  at [facility] Main number at  [phone]. ° °

## 2017-05-01 NOTE — Anesthesia Procedure Notes (Signed)
Procedure Name: Intubation Date/Time: 05/01/2017 10:30 AM Performed by: Nelda Marseille, CRNA Pre-anesthesia Checklist: Patient identified, Patient being monitored, Timeout performed, Emergency Drugs available and Suction available Patient Re-evaluated:Patient Re-evaluated prior to induction Oxygen Delivery Method: Circle system utilized Preoxygenation: Pre-oxygenation with 100% oxygen Induction Type: IV induction Ventilation: Mask ventilation without difficulty Laryngoscope Size: Mac, 3 and McGraph Grade View: Grade IV Tube type: Oral Tube size: 7.0 mm Number of attempts: 1 Airway Equipment and Method: Stylet Placement Confirmation: ETT inserted through vocal cords under direct vision,  positive ETCO2 and breath sounds checked- equal and bilateral Secured at: 22 cm Tube secured with: Tape Dental Injury: Teeth and Oropharynx as per pre-operative assessment  Difficulty Due To: Difficulty was anticipated and Difficult Airway- due to anterior larynx

## 2017-05-01 NOTE — Op Note (Signed)
Total Laparoscopic Hysterectomy Operative Note Procedure Date: 05/01/2017  Patient:  Julie Sharp  46 y.o. female  PRE-OPERATIVE DIAGNOSIS:  AUB  Stenotic Cervix  POST-OPERATIVE DIAGNOSIS:  AUB  Stenotic Cervix, large right ovarian cyst  PROCEDURE:  Procedure(s): HYSTERECTOMY TOTAL LAPAROSCOPIC (N/A) LAPAROSCOPIC SALPINGO OOPHORECTOMY (Right) LAPAROSCOPIC UNILATERAL SALPINGECTOMY (Left)  SURGEON:  Surgeon(s) and Role:    * Debborah Alonge, Honor Loh, MD - Primary Assist: Benjaman Kindler, MD  ANESTHESIA:  General via ET  I/O  Total I/O In: 1200 [I.V.:1200] Out: 50 [Blood:50]  FINDINGS:  Small uterus, normal fallopian tubes bilaterally, normal left ovary, right ovary enlarged to 7cm with cystic fluid and surrounding vascular congestion.  Normal upper abdomen.  SPECIMEN: Uterus, Cervix, bilateral fallopian tubes, and right ovary.  COMPLICATIONS: none apparent  DISPOSITION: vital signs stable to PACU  Indication for Surgery: 46 y.o. with pelvic pain, and known stenotic cervix with chronic hematometra, unsatisfactorily responsive to progestin IUD.  Risks of surgery were discussed with the patient including but not limited to: bleeding which may require transfusion or reoperation; infection which may require antibiotics; injury to bowel, bladder, ureters or other surrounding organs; need for additional procedures including laparotomy, blood clot, incisional problems and other postoperative/anesthesia complications. Written informed consent was obtained.    PROCEDURE IN DETAIL:  The patient had 5000u Heparin Sub-q and sequential compression devices applied to her lower extremities while in the preoperative area.  She was then taken to the operating room. IV antibiotics were given. General anesthesia was administered via endotracheal route.  She was placed in the dorsal lithotomy position, and was prepped and draped in a sterile manner. A surgical time-out was performed.  A Foley catheter was  inserted into her bladder and attached to constant drainage and a V-Care uterine manipulator was then advanced into the uterus and a good fit around the cervix was noted. The gloves were changed, and attention was turned to the abdomen where an umbilical incision was made with the scalpel.  An 50mm trochar was inserted in the umbilical incision using a visiport method.Opening pressure was 4mmHg, and the abdomen was insufflated to 24mmHg carbon dioxide gas and adequate pneumoperitoneum was obtained. A survey of the patient's pelvis and abdomen revealed the findings as mentioned above. Two 49mm ports were inserted in the lower left and right quadrants under visualization.    The right round ligament was transected using the Ligasure.  The peritoneum along the pelvic side wall and surrounding the IP ligament was divided and the IP ligament brought medially.  The ureter was identified and seen vermiculating.  The IP was thrice cauterized and divided, distant to the ureter.  The underlying broad ligament was divided toward the cervix, and the anterior broad ligament brought to the cervix.  The left fallopian tube was separated from the mesosalpinx using the Ligasure. The left round and uteroovarian ligaments were transected and anterior broad ligament divided and brought across the uterus to separate the vesicouterine peritoneum and create a bladder flap. The bladder was pushed away from the uterus. The bilateral uterine arteries were skeletonized, ligated and transected. The bilateral uterosacral and cardinal ligaments were ligated and transected. A colpotomy was made around the V-Care cervical cup and the uterus, cervix, bilateral tubes, and right ovary were removed through the vagina. The vaginal cuff was closed with the endostitch device and V-Lock suture. This was tested for integrity using the surgeon's finger. After a change of gloves, the pneumoperitoneum was recreated and surgical site inspected, and  found  to be hemostatic. Bilateral ureters were visualized vermiuclating. No intraoperative injury to surrounding organs was noted. The umbilical trochar site was closed with 2-0 vicryl using the inlet closure device. The abdomen was desufflated and all instruments were then removed.   All skin incisions were closed with 4-0 monocryl and covered with surgical glue. The vaginal tissues were injected with long- and short-acting bupivacaine.   The patient tolerated the procedures well.  All instruments, needles, and sponge counts were correct x 2. The patient was taken to the recovery room in stable condition.   ---- Larey Days, MD Attending Obstetrician and [facility]

## 2017-05-01 NOTE — Transfer of Care (Signed)
Immediate Anesthesia Transfer of Care Note  Patient: JADALEE WESTCOTT  Procedure(s) Performed: HYSTERECTOMY TOTAL LAPAROSCOPIC (N/A ) LAPAROSCOPIC SALPINGO OOPHORECTOMY (Right ) LAPAROSCOPIC UNILATERAL SALPINGECTOMY (Left )  Patient Location: PACU  Anesthesia Type:General  Level of Consciousness: awake and sedated  Airway & Oxygen Therapy: Patient Spontanous Breathing and Patient connected to face mask oxygen  Post-op Assessment: Report given to RN and Post -op Vital signs reviewed and stable  Post vital signs: Reviewed and stable  Last Vitals:  Vitals:   05/01/17 0855  BP: 132/82  Pulse: 89  Resp: 14  Temp: 36.7 C  SpO2: 98%    Last Pain:  Vitals:   05/01/17 0855  TempSrc: Oral         Complications: No apparent anesthesia complications

## 2017-05-01 NOTE — Anesthesia Postprocedure Evaluation (Signed)
Anesthesia Post Note  Patient: DELANY STEURY  Procedure(s) Performed: HYSTERECTOMY TOTAL LAPAROSCOPIC (N/A ) LAPAROSCOPIC SALPINGO OOPHORECTOMY (Right ) LAPAROSCOPIC UNILATERAL SALPINGECTOMY (Left )  Patient location during evaluation: PACU Anesthesia Type: General Level of consciousness: awake and alert and oriented Pain management: pain level controlled Vital Signs Assessment: post-procedure vital signs reviewed and stable Respiratory status: spontaneous breathing, nonlabored ventilation and respiratory function stable Cardiovascular status: blood pressure returned to baseline and stable Postop Assessment: no signs of nausea or vomiting Anesthetic complications: no     Last Vitals:  Vitals:   05/01/17 1322 05/01/17 1338  BP: 121/86 125/83  Pulse: 79 75  Resp: 13 18  Temp:  37.1 C  SpO2: 98% 99%    Last Pain:  Vitals:   05/01/17 1338  TempSrc: Temporal  PainSc: 7                  Shaquita Fort

## 2017-05-02 ENCOUNTER — Encounter: Payer: Self-pay | Admitting: Obstetrics & Gynecology

## 2017-05-14 LAB — SURGICAL PATHOLOGY

## 2017-07-04 DIAGNOSIS — Z23 Encounter for immunization: Secondary | ICD-10-CM | POA: Diagnosis not present

## 2017-08-05 DIAGNOSIS — L308 Other specified dermatitis: Secondary | ICD-10-CM | POA: Diagnosis not present

## 2017-09-01 DIAGNOSIS — M79672 Pain in left foot: Secondary | ICD-10-CM | POA: Diagnosis not present

## 2017-09-04 DIAGNOSIS — M7672 Peroneal tendinitis, left leg: Secondary | ICD-10-CM | POA: Diagnosis not present

## 2018-02-17 DIAGNOSIS — M779 Enthesopathy, unspecified: Secondary | ICD-10-CM | POA: Diagnosis not present

## 2018-02-17 DIAGNOSIS — M79672 Pain in left foot: Secondary | ICD-10-CM | POA: Diagnosis not present

## 2018-02-17 DIAGNOSIS — M79671 Pain in right foot: Secondary | ICD-10-CM | POA: Diagnosis not present

## 2018-02-17 DIAGNOSIS — M898X9 Other specified disorders of bone, unspecified site: Secondary | ICD-10-CM | POA: Diagnosis not present

## 2018-03-10 DIAGNOSIS — M65871 Other synovitis and tenosynovitis, right ankle and foot: Secondary | ICD-10-CM | POA: Diagnosis not present

## 2018-03-10 DIAGNOSIS — M7671 Peroneal tendinitis, right leg: Secondary | ICD-10-CM | POA: Diagnosis not present

## 2018-03-10 DIAGNOSIS — M19072 Primary osteoarthritis, left ankle and foot: Secondary | ICD-10-CM | POA: Diagnosis not present

## 2018-03-12 DIAGNOSIS — M7671 Peroneal tendinitis, right leg: Secondary | ICD-10-CM | POA: Diagnosis not present

## 2018-03-16 DIAGNOSIS — M7671 Peroneal tendinitis, right leg: Secondary | ICD-10-CM | POA: Diagnosis not present

## 2018-03-18 DIAGNOSIS — M7671 Peroneal tendinitis, right leg: Secondary | ICD-10-CM | POA: Diagnosis not present

## 2018-03-25 DIAGNOSIS — M7671 Peroneal tendinitis, right leg: Secondary | ICD-10-CM | POA: Diagnosis not present

## 2018-04-06 DIAGNOSIS — M7671 Peroneal tendinitis, right leg: Secondary | ICD-10-CM | POA: Diagnosis not present

## 2018-04-21 DIAGNOSIS — M7671 Peroneal tendinitis, right leg: Secondary | ICD-10-CM | POA: Diagnosis not present

## 2018-04-21 DIAGNOSIS — M65872 Other synovitis and tenosynovitis, left ankle and foot: Secondary | ICD-10-CM | POA: Diagnosis not present

## 2018-04-21 DIAGNOSIS — M779 Enthesopathy, unspecified: Secondary | ICD-10-CM | POA: Diagnosis not present

## 2018-04-26 ENCOUNTER — Other Ambulatory Visit: Payer: Self-pay | Admitting: Podiatry

## 2018-04-26 DIAGNOSIS — M7671 Peroneal tendinitis, right leg: Secondary | ICD-10-CM

## 2018-05-07 ENCOUNTER — Ambulatory Visit: Payer: 59

## 2018-06-01 ENCOUNTER — Other Ambulatory Visit: Payer: Self-pay | Admitting: Obstetrics & Gynecology

## 2018-06-01 DIAGNOSIS — Z1231 Encounter for screening mammogram for malignant neoplasm of breast: Secondary | ICD-10-CM

## 2018-06-21 ENCOUNTER — Ambulatory Visit
Admission: RE | Admit: 2018-06-21 | Discharge: 2018-06-21 | Disposition: A | Discharge status: Home / Self Care | Payer: BLUE CROSS/BLUE SHIELD | Source: Ambulatory Visit | Attending: Radiology | Admitting: Radiology

## 2018-06-21 DIAGNOSIS — Z1231 Encounter for screening mammogram for malignant neoplasm of breast: Secondary | ICD-10-CM | POA: Insufficient documentation

## 2019-03-02 ENCOUNTER — Other Ambulatory Visit: Payer: Self-pay

## 2019-03-02 DIAGNOSIS — Z20822 Contact with and (suspected) exposure to covid-19: Secondary | ICD-10-CM

## 2019-03-03 LAB — NOVEL CORONAVIRUS, NAA: SARS-CoV-2, NAA: NOT DETECTED

## 2019-09-02 ENCOUNTER — Ambulatory Visit: Payer: Self-pay | Attending: Internal Medicine

## 2019-09-02 DIAGNOSIS — Z23 Encounter for immunization: Secondary | ICD-10-CM

## 2019-09-02 NOTE — Progress Notes (Signed)
   Covid-19 Vaccination Clinic  Name:  AMAI WHITTON    MRN: [id number] DOB: 1971/05/02  09/02/2019  Ms. Beecher was observed post Covid-19 immunization for 15 minutes without incident. She was provided with Vaccine Information Sheet and instruction to access the V-Safe system.   Ms. Rathore was instructed to call 911 with any severe reactions post vaccine: Marland Kitchen Difficulty breathing  . Swelling of face and throat  . A fast heartbeat  . A bad rash all over body  . Dizziness and weakness   Immunizations Administered    Name Date Dose VIS Date Route   Pfizer COVID-19 Vaccine 09/02/2019  9:02 AM 0.3 mL 05/06/2019 Intramuscular   Manufacturer: [place]   Lot: [id number]   [place]: [id number]

## 2019-09-03 ENCOUNTER — Ambulatory Visit: Payer: BC Managed Care – PPO

## 2019-09-28 ENCOUNTER — Ambulatory Visit: Payer: Self-pay | Attending: Internal Medicine

## 2019-09-28 DIAGNOSIS — Z23 Encounter for immunization: Secondary | ICD-10-CM

## 2019-09-28 NOTE — Progress Notes (Signed)
   Covid-19 Vaccination Clinic  Name:  Julie Sharp    MRN: [id number] DOB: 11/20/1970  09/28/2019  [name] was observed post Covid-19 immunization for 15 minutes without incident. She was provided with Vaccine Information Sheet and instruction to access the V-Safe system.   [name] was instructed to call 911 with any severe reactions post vaccine: Marland Kitchen Difficulty breathing  . Swelling of face and throat  . A fast heartbeat  . A bad rash all over body  . Dizziness and weakness   Immunizations Administered    Name Date Dose VIS Date Route   Pfizer COVID-19 Vaccine 09/28/2019  8:44 AM 0.3 mL 07/20/2018 Intramuscular   Manufacturer: [place]   Lot: [id number]   [place]: [id number]

## 2020-01-24 ENCOUNTER — Other Ambulatory Visit: Payer: Self-pay | Admitting: Obstetrics & Gynecology

## 2020-01-24 DIAGNOSIS — Z1231 Encounter for screening mammogram for malignant neoplasm of breast: Secondary | ICD-10-CM

## 2020-02-14 ENCOUNTER — Other Ambulatory Visit: Payer: Self-pay

## 2020-02-14 ENCOUNTER — Ambulatory Visit
Admission: RE | Admit: 2020-02-14 | Discharge: 2020-02-14 | Disposition: A | Discharge status: Home / Self Care | Payer: No Typology Code available for payment source | Source: Ambulatory Visit | Attending: Obstetrics & Gynecology | Admitting: Obstetrics & Gynecology

## 2020-02-14 DIAGNOSIS — Z1231 Encounter for screening mammogram for malignant neoplasm of breast: Secondary | ICD-10-CM | POA: Insufficient documentation

## 2021-03-27 ENCOUNTER — Other Ambulatory Visit: Payer: Self-pay | Admitting: Internal Medicine

## 2021-03-27 DIAGNOSIS — Z1231 Encounter for screening mammogram for malignant neoplasm of breast: Secondary | ICD-10-CM

## 2021-05-28 ENCOUNTER — Inpatient Hospital Stay: Admission: RE | Admit: 2021-05-28 | Payer: No Typology Code available for payment source | Source: Ambulatory Visit

## 2021-06-03 ENCOUNTER — Other Ambulatory Visit: Payer: Self-pay

## 2021-06-03 ENCOUNTER — Ambulatory Visit
Admission: RE | Admit: 2021-06-03 | Discharge: 2021-06-03 | Disposition: A | Discharge status: Home / Self Care | Payer: No Typology Code available for payment source | Source: Ambulatory Visit | Attending: Internal Medicine | Admitting: Internal Medicine

## 2021-06-03 DIAGNOSIS — Z1231 Encounter for screening mammogram for malignant neoplasm of breast: Secondary | ICD-10-CM | POA: Insufficient documentation

## 2021-12-20 ENCOUNTER — Other Ambulatory Visit: Payer: Self-pay

## 2021-12-20 ENCOUNTER — Encounter
Admission: RE | Admit: 2021-12-20 | Discharge: 2021-12-20 | Disposition: A | Discharge status: Home / Self Care | Payer: No Typology Code available for payment source | Source: Ambulatory Visit | Attending: Podiatry | Admitting: Podiatry

## 2021-12-20 HISTORY — DX: Gastro-esophageal reflux disease without esophagitis: K21.9

## 2021-12-20 HISTORY — DX: Pneumonia, unspecified organism: J18.9

## 2021-12-20 NOTE — Patient Instructions (Signed)
Your procedure is scheduled on: 01/03/22 - Friday Report to the Registration Desk on the 1st floor of the [facility]. To find out your arrival time, please call [phone] between 1PM - 3PM on: 01/02/22 - Thursday If your arrival time is 6:00 am, do not arrive prior to that time as the [facility] entrance doors do not open until 6:00 am.  REMEMBER: Instructions that are not followed completely may result in serious medical risk, up to and including death; or upon the discretion of your surgeon and anesthesiologist your surgery may need to be rescheduled.  Do not eat food after midnight the night before surgery.  No gum chewing, lozengers or hard candies.  You may however, drink CLEAR liquids up to 2 hours before you are scheduled to arrive for your surgery. Do not drink anything within 2 hours of your scheduled arrival time.  Clear liquids include: - water  - apple juice without pulp - gatorade (not RED colors) - black coffee or tea (Do NOT add milk or creamers to the coffee or tea) Do NOT drink anything that is not on this list.  TAKE THESE MEDICATIONS THE MORNING OF SURGERY WITH A SIP OF WATER:  - pantoprazole (PROTONIX) 40 MG tablet, (take one the night before and one on the morning of surgery - helps to prevent nausea after surgery.) - sertraline (ZOLOFT) 50 MG tablet  One week prior to surgery: Stop Anti-inflammatories (NSAIDS) such as Advil, Aleve, Ibuprofen, Motrin, Naproxen, Naprosyn and Aspirin based products such as Excedrin, Goodys Powder, BC Powder.  Stop ANY OVER THE COUNTER supplements until after surgery.  You may take Tylenol if needed for pain up until the day of surgery.  No Alcohol for 24 hours before or after surgery.  No Smoking including e-cigarettes for 24 hours prior to surgery.  No chewable tobacco products for at least 6 hours prior to surgery.  No nicotine patches on the day of surgery.  Do not use any "recreational" drugs for at least a week  prior to your surgery.  Please be advised that the combination of cocaine and anesthesia may have negative outcomes, up to and including death. If you test positive for cocaine, your surgery will be cancelled.  On the morning of surgery brush your teeth with toothpaste and water, you may rinse your mouth with mouthwash if you wish. Do not swallow any toothpaste or mouthwash.  Do not wear jewelry, make-up, hairpins, clips or nail polish.  Do not wear lotions, powders, or perfumes.   Do not shave body from the neck down 48 hours prior to surgery just in case you cut yourself which could leave a site for infection.  Also, freshly shaved skin may become irritated if using the CHG soap.  Contact lenses, hearing aids and dentures may not be worn into surgery.  Do not bring valuables to the hospital. [facility] is not responsible for any missing/lost belongings or valuables.   Notify your doctor if there is any change in your medical condition (cold, fever, infection).  Wear comfortable clothing (specific to your surgery type) to the hospital.  After surgery, you can help prevent lung complications by doing breathing exercises.  Take deep breaths and cough every 1-2 hours. Your doctor may order a device called an Incentive Spirometer to help you take deep breaths. When coughing or sneezing, hold a pillow firmly against your incision with both hands. This is called "splinting." Doing this helps protect your incision. It also decreases belly  discomfort.  If you are being admitted to the hospital overnight, leave your suitcase in the car. After surgery it may be brought to your room.  If you are being discharged the day of surgery, you will not be allowed to drive home. You will need a responsible adult (18 years or older) to drive you home and stay with you that night.   If you are taking public transportation, you will need to have a responsible adult (18 years or older) with you. Please  confirm with your physician that it is acceptable to use public transportation.   Please call the [facility] Dept. at [phone] if you have any questions about these instructions.  Surgery Visitation Policy:  Patients undergoing a surgery or procedure may have two family members or support persons with them as long as the person is not COVID-19 positive or experiencing its symptoms.   Inpatient Visitation:    Visiting hours are 7 a.m. to 8 p.m. Up to four visitors are allowed at one time in a patient room, including children. The visitors may rotate out with other people during the day. One designated support person (adult) may remain overnight.

## 2021-12-31 ENCOUNTER — Other Ambulatory Visit: Payer: Self-pay | Admitting: Podiatry

## 2022-01-03 ENCOUNTER — Ambulatory Visit: Payer: No Typology Code available for payment source | Admitting: Anesthesiology

## 2022-01-03 ENCOUNTER — Other Ambulatory Visit: Payer: Self-pay

## 2022-01-03 ENCOUNTER — Encounter
Admission: RE | Disposition: A | Discharge status: Home / Self Care | Payer: Self-pay | Source: Home / Self Care | Attending: Podiatry

## 2022-01-03 ENCOUNTER — Ambulatory Visit
Admission: RE | Admit: 2022-01-03 | Discharge: 2022-01-03 | Disposition: A | Discharge status: Home / Self Care | Payer: No Typology Code available for payment source | Attending: Podiatry | Admitting: Podiatry

## 2022-01-03 ENCOUNTER — Encounter: Payer: Self-pay | Admitting: Podiatry

## 2022-01-03 ENCOUNTER — Ambulatory Visit: Payer: No Typology Code available for payment source

## 2022-01-03 DIAGNOSIS — Z6835 Body mass index (BMI) 35.0-35.9, adult: Secondary | ICD-10-CM | POA: Diagnosis not present

## 2022-01-03 DIAGNOSIS — M205X2 Other deformities of toe(s) (acquired), left foot: Secondary | ICD-10-CM | POA: Insufficient documentation

## 2022-01-03 DIAGNOSIS — K219 Gastro-esophageal reflux disease without esophagitis: Secondary | ICD-10-CM | POA: Insufficient documentation

## 2022-01-03 DIAGNOSIS — E669 Obesity, unspecified: Secondary | ICD-10-CM | POA: Diagnosis not present

## 2022-01-03 HISTORY — PX: CHEILECTOMY: SHX1336

## 2022-01-03 SURGERY — CHEILECTOMY
Anesthesia: General | Site: Foot | Laterality: Left

## 2022-01-03 MED ORDER — PROMETHAZINE HCL 25 MG/ML IJ SOLN
INTRAMUSCULAR | Status: AC
  Filled 2022-01-03: qty 1

## 2022-01-03 MED ORDER — PROPOFOL 10 MG/ML IV BOLUS
INTRAVENOUS | Status: DC | PRN
  Administered 2022-01-03: 200 mg via INTRAVENOUS

## 2022-01-03 MED ORDER — PROMETHAZINE HCL 25 MG/ML IJ SOLN
6.2500 mg | INTRAMUSCULAR | Status: DC | PRN
  Administered 2022-01-03: 6.25 mg via INTRAVENOUS

## 2022-01-03 MED ORDER — LACTATED RINGERS IV SOLN: INTRAVENOUS | Status: DC

## 2022-01-03 MED ORDER — BUPIVACAINE HCL (PF) 0.5 % IJ SOLN
INTRAMUSCULAR | Status: AC
  Filled 2022-01-03: qty 30

## 2022-01-03 MED ORDER — MIDAZOLAM HCL 2 MG/2ML IJ SOLN
INTRAMUSCULAR | Status: DC | PRN
  Administered 2022-01-03: 2 mg via INTRAVENOUS

## 2022-01-03 MED ORDER — FENTANYL CITRATE (PF) 100 MCG/2ML IJ SOLN
INTRAMUSCULAR | Status: DC | PRN
Start: 2022-01-03 — End: 2022-01-03
  Administered 2022-01-03 (×2): 50 ug via INTRAVENOUS

## 2022-01-03 MED ORDER — OXYCODONE HCL 5 MG/5ML PO SOLN: 5.0000 mg | Freq: Once | ORAL | Status: AC | PRN

## 2022-01-03 MED ORDER — CHLORHEXIDINE GLUCONATE 0.12 % MT SOLN
OROMUCOSAL | Status: AC
  Filled 2022-01-03: qty 15

## 2022-01-03 MED ORDER — GLYCOPYRROLATE 0.2 MG/ML IJ SOLN
INTRAMUSCULAR | Status: DC | PRN
  Administered 2022-01-03: .2 mg via INTRAVENOUS

## 2022-01-03 MED ORDER — MIDAZOLAM HCL 2 MG/2ML IJ SOLN
INTRAMUSCULAR | Status: AC
  Filled 2022-01-03: qty 2

## 2022-01-03 MED ORDER — ONDANSETRON HCL 4 MG/2ML IJ SOLN
INTRAMUSCULAR | Status: DC | PRN
  Administered 2022-01-03 (×2): 4 mg via INTRAVENOUS

## 2022-01-03 MED ORDER — DEXAMETHASONE SODIUM PHOSPHATE 10 MG/ML IJ SOLN
INTRAMUSCULAR | Status: DC | PRN
  Administered 2022-01-03: 10 mg via INTRAVENOUS

## 2022-01-03 MED ORDER — FENTANYL CITRATE (PF) 100 MCG/2ML IJ SOLN
INTRAMUSCULAR | Status: AC
  Filled 2022-01-03: qty 2

## 2022-01-03 MED ORDER — SUCCINYLCHOLINE CHLORIDE 200 MG/10ML IV SOSY
PREFILLED_SYRINGE | INTRAVENOUS | Status: DC | PRN
  Administered 2022-01-03: 100 mg via INTRAVENOUS

## 2022-01-03 MED ORDER — PROPOFOL 1000 MG/100ML IV EMUL
INTRAVENOUS | Status: AC
  Filled 2022-01-03: qty 100

## 2022-01-03 MED ORDER — 0.9 % SODIUM CHLORIDE (POUR BTL) OPTIME
TOPICAL | Status: DC | PRN
  Administered 2022-01-03: 500 mL

## 2022-01-03 MED ORDER — HYDROCODONE-ACETAMINOPHEN 5-325 MG PO TABS: 1.0000 | ORAL_TABLET | ORAL | 0 refills | Status: AC | PRN

## 2022-01-03 MED ORDER — LIDOCAINE HCL (CARDIAC) PF 100 MG/5ML IV SOSY
PREFILLED_SYRINGE | INTRAVENOUS | Status: DC | PRN
  Administered 2022-01-03: 100 mg via INTRAVENOUS

## 2022-01-03 MED ORDER — FENTANYL CITRATE (PF) 100 MCG/2ML IJ SOLN
25.0000 ug | INTRAMUSCULAR | Status: DC | PRN
  Administered 2022-01-03: 25 ug via INTRAVENOUS

## 2022-01-03 MED ORDER — ACETAMINOPHEN 10 MG/ML IV SOLN
INTRAVENOUS | Status: AC
  Filled 2022-01-03: qty 100

## 2022-01-03 MED ORDER — ORAL CARE MOUTH RINSE: 15.0000 mL | Freq: Once | OROMUCOSAL | Status: AC

## 2022-01-03 MED ORDER — DROPERIDOL 2.5 MG/ML IJ SOLN: 0.6250 mg | Freq: Once | INTRAMUSCULAR | Status: DC | PRN

## 2022-01-03 MED ORDER — OXYCODONE HCL 5 MG PO TABS
5.0000 mg | ORAL_TABLET | Freq: Once | ORAL | Status: AC | PRN
  Administered 2022-01-03: 5 mg via ORAL

## 2022-01-03 MED ORDER — CHLORHEXIDINE GLUCONATE 0.12 % MT SOLN
15.0000 mL | Freq: Once | OROMUCOSAL | Status: AC
  Administered 2022-01-03: 15 mL via OROMUCOSAL

## 2022-01-03 MED ORDER — OXYCODONE HCL 5 MG PO TABS
ORAL_TABLET | ORAL | Status: AC
  Filled 2022-01-03: qty 1

## 2022-01-03 MED ORDER — BUPIVACAINE HCL (PF) 0.5 % IJ SOLN
INTRAMUSCULAR | Status: DC | PRN
  Administered 2022-01-03: 10 mL

## 2022-01-03 MED ORDER — ACETAMINOPHEN 10 MG/ML IV SOLN
INTRAVENOUS | Status: DC | PRN
  Administered 2022-01-03: 1000 mg via INTRAVENOUS

## 2022-01-03 MED ORDER — ACETAMINOPHEN 10 MG/ML IV SOLN: 1000.0000 mg | Freq: Once | INTRAVENOUS | Status: DC | PRN

## 2022-01-03 MED ORDER — CEFAZOLIN SODIUM-DEXTROSE 2-3 GM-%(50ML) IV SOLR
INTRAVENOUS | Status: DC | PRN
  Administered 2022-01-03: 2 g via INTRAVENOUS

## 2022-01-03 SURGICAL SUPPLY — 49 items
BLADE MED AGGRESSIVE (BLADE) ×2 IMPLANT
BLADE SURG 15 STRL LF DISP TIS (BLADE) ×2 IMPLANT
BLADE SURG 15 STRL SS (BLADE) ×2
BLADE SURG MINI STRL (BLADE) ×2 IMPLANT
BNDG CONFORM 3 STRL LF (GAUZE/BANDAGES/DRESSINGS) ×1 IMPLANT
BNDG ELASTIC 4X5.8 VLCR NS LF (GAUZE/BANDAGES/DRESSINGS) ×2 IMPLANT
BNDG ELASTIC 4X5.8 VLCR STR LF (GAUZE/BANDAGES/DRESSINGS) ×1 IMPLANT
BNDG ESMARK 4X12 TAN STRL LF (GAUZE/BANDAGES/DRESSINGS) ×2 IMPLANT
BNDG GAUZE DERMACEA FLUFF (GAUZE/BANDAGES/DRESSINGS) ×1
BNDG GAUZE DERMACEA FLUFF 4 (GAUZE/BANDAGES/DRESSINGS) ×1 IMPLANT
BNDG STRETCH GAUZE 3IN X12FT (GAUZE/BANDAGES/DRESSINGS) ×2 IMPLANT
CUFF TOURN SGL QUICK 12 (TOURNIQUET CUFF) IMPLANT
CUFF TOURN SGL QUICK 18X4 (TOURNIQUET CUFF) IMPLANT
DRAPE FLUOR MINI C-ARM 54X84 (DRAPES) ×2 IMPLANT
DURAPREP 26ML APPLICATOR (WOUND CARE) ×2 IMPLANT
ELECT REM PT RETURN 9FT ADLT (ELECTROSURGICAL) ×2
ELECTRODE REM PT RTRN 9FT ADLT (ELECTROSURGICAL) ×1 IMPLANT
GAUZE SPONGE 4X4 12PLY STRL (GAUZE/BANDAGES/DRESSINGS) ×2 IMPLANT
GAUZE STRETCH 2X75IN STRL (MISCELLANEOUS) ×2 IMPLANT
GAUZE XEROFORM 1X8 LF (GAUZE/BANDAGES/DRESSINGS) ×2 IMPLANT
GLOVE BIO SURGEON STRL SZ7.5 (GLOVE) ×2 IMPLANT
GLOVE SURG UNDER LTX SZ8 (GLOVE) ×2 IMPLANT
GOWN STRL REUS W/ TWL LRG LVL3 (GOWN DISPOSABLE) ×2 IMPLANT
GOWN STRL REUS W/TWL LRG LVL3 (GOWN DISPOSABLE) ×2
KIT TURNOVER KIT A (KITS) ×2 IMPLANT
LABEL OR SOLS (LABEL) ×2 IMPLANT
MANIFOLD NEPTUNE II (INSTRUMENTS) ×2 IMPLANT
NDL FILTER BLUNT 18X1 1/2 (NEEDLE) ×1 IMPLANT
NDL HYPO 25X1 1.5 SAFETY (NEEDLE) ×2 IMPLANT
NEEDLE FILTER BLUNT 18X 1/2SAF (NEEDLE) ×1
NEEDLE FILTER BLUNT 18X1 1/2 (NEEDLE) ×1 IMPLANT
NEEDLE HYPO 25X1 1.5 SAFETY (NEEDLE) ×4 IMPLANT
NS IRRIG 500ML POUR BTL (IV SOLUTION) ×2 IMPLANT
PACK EXTREMITY ARMC (MISCELLANEOUS) ×2 IMPLANT
PAD CAST CTTN 4X4 STRL (SOFTGOODS) ×1 IMPLANT
PADDING CAST COTTON 4X4 STRL (SOFTGOODS) ×1
RASP SM TEAR CROSS CUT (RASP) ×2 IMPLANT
SOL PREP PVP 2OZ (MISCELLANEOUS) ×2
SOLUTION PREP PVP 2OZ (MISCELLANEOUS) ×1 IMPLANT
SPLINT FAST PLASTER 5X30 (CAST SUPPLIES) ×1
SPLINT PLASTER CAST FAST 5X30 (CAST SUPPLIES) ×1 IMPLANT
STOCKINETTE STRL 6IN 960660 (GAUZE/BANDAGES/DRESSINGS) ×2 IMPLANT
STRAP SAFETY 5IN WIDE (MISCELLANEOUS) ×2 IMPLANT
STRIP CLOSURE SKIN 1/4X4 (GAUZE/BANDAGES/DRESSINGS) ×2 IMPLANT
SUT ETHILON 5-0 FS-2 18 BLK (SUTURE) ×2 IMPLANT
SUT VIC AB 4-0 FS2 27 (SUTURE) ×2 IMPLANT
SYR 10ML LL (SYRINGE) ×4 IMPLANT
TRAP FLUID SMOKE EVACUATOR (MISCELLANEOUS) ×2 IMPLANT
WATER STERILE IRR 500ML POUR (IV SOLUTION) ×2 IMPLANT

## 2022-01-03 NOTE — Interval H&P Note (Signed)
History and Physical Interval Note:  01/03/2022 7:06 AM  Julie Sharp Evern Core  has presented today for surgery, with the diagnosis of M20.22 - Hallux rigidus M25.70 - Exostosis/Osteophyte.  The various methods of treatment have been discussed with the patient and family. After consideration of risks, benefits and other options for treatment, the patient has consented to  Procedure(s): CHEILECTOMY (Left) as a surgical intervention.  The patient's history has been reviewed, patient examined, no change in status, stable for surgery.  I have reviewed the patient's chart and labs.  Questions were answered to the patient's satisfaction.     Durward Fortes

## 2022-01-03 NOTE — Evaluation (Signed)
Physical Therapy Evaluation Patient Details Name: Julie Sharp MRN: [id number] DOB: 1970/12/10 Today's Date: 01/03/2022  History of Present Illness  Pt is a 51 yo F diagnosed with hallux limitus of the left foot and is s/p cheilectomy of the left great toe joint.  PMH includes: anxiety and GERD.   Clinical Impression  Pt was pleasant and motivated to participate during the session and put forth good effort throughout. Pt required cuing initially for proper sequencing with crutches during transfer, gait, and stair training but demonstrated excellent carryover of proper sequencing.  Pt was steady throughout the session including during stair training with spouse present for training on guarding technique.  No further skilled PT needs in the acute care setting with pt to follow surgeon's recommendation for follow up therapies as needed.  Will complete PT orders at this time but will reassess pt pending a change in status upon receipt of new PT orders.         Recommendations for follow up therapy are one component of a multi-disciplinary discharge planning process, led by the attending physician.  Recommendations may be updated based on patient status, additional functional criteria and insurance authorization.  Follow Up Recommendations Follow physician's recommendations for discharge plan and follow up therapies      Assistance Recommended at Discharge Frequent or constant Supervision/Assistance  Patient can return home with the following  Assist for transportation;Help with stairs or ramp for entrance    Equipment Recommendations None recommended by PT  Recommendations for Other Services       Functional Status Assessment Patient has had a recent decline in their functional status and demonstrates the ability to make significant improvements in function in a reasonable and predictable amount of time.     Precautions / Restrictions Precautions Precautions:  None Restrictions Weight Bearing Restrictions: Yes LLE Weight Bearing: Weight bearing as tolerated Other Position/Activity Restrictions: LLE WBAT on the heel only with surgical shoe donned      Mobility  Bed Mobility               General bed mobility comments: NT, in recliner    Transfers Overall transfer level: Modified independent Equipment used: Crutches               General transfer comment: Min verbal and visual cues initially for sequencing with crutches but pt demonstrated excellent carryover and was Mod I with transfers with no cuing needed by end of session; car transfer sequencing education provided    Ambulation/Gait Ambulation/Gait assistance: Modified independent (Device/Increase time) Gait Distance (Feet): 50 Feet Assistive device: Crutches Gait Pattern/deviations: Step-to pattern Gait velocity: decreased     General Gait Details: Min verbal and visual cues initially for proper sequencing with crutches but pt progressed quickly to Mod Ind with excellent carryover of proper sequencing; excellent stability and compliance with WB on L heel with surgical shoe  Stairs Stairs: Yes Stairs assistance: Supervision Stair Management: No rails, With crutches Number of Stairs: 4 General stair comments: Visual and verbal cues for proper sequencing with pt and spouse with spouse education provided on proper guarding technique; pt steady throughout with good carryover of proper sequencing  Wheelchair Mobility    Modified Rankin (Stroke Patients Only)       Balance Overall balance assessment: No apparent balance deficits (not formally assessed)  Pertinent Vitals/Pain Pain Assessment Pain Assessment: No/denies pain    Home Living Family/patient expects to be discharged to:: Private residence Living Arrangements: Spouse/significant other Available Help at Discharge: Family;Available 24  hours/day Type of Home: House Home Access: Stairs to enter Entrance Stairs-Rails: None Entrance Stairs-Number of Steps: 3   Home Layout: Two level;Able to live on main level with bedroom/bathroom Home Equipment: None      Prior Function Prior Level of Function : Independent/Modified Independent             Mobility Comments: Ind amb community distances without an AD, no fall history, exercises at a local gym with a personal trainer ADLs Comments: Ind with ADLs     Hand Dominance        Extremity/Trunk Assessment   Upper Extremity Assessment Upper Extremity Assessment: Overall WFL for tasks assessed    Lower Extremity Assessment Lower Extremity Assessment: Overall WFL for tasks assessed       Communication   Communication: No difficulties  Cognition Arousal/Alertness: Awake/alert Behavior During Therapy: WFL for tasks assessed/performed Overall Cognitive Status: Within Functional Limits for tasks assessed                                          General Comments      Exercises     Assessment/Plan    PT Assessment Patient does not need any further PT services  PT Problem List         PT Treatment Interventions      PT Goals (Current goals can be found in the Care Plan section)  Acute Rehab PT Goals PT Goal Formulation: All assessment and education complete, DC therapy    Frequency       Co-evaluation               AM-PAC PT "6 Clicks" Mobility  Outcome Measure Help needed turning from your back to your side while in a flat bed without using bedrails?: None Help needed moving from lying on your back to sitting on the side of a flat bed without using bedrails?: None Help needed moving to and from a bed to a chair (including a wheelchair)?: None Help needed standing up from a chair using your arms (e.g., wheelchair or bedside chair)?: None Help needed to walk in hospital room?: None Help needed climbing 3-5 steps with a  railing? : A Little 6 Click Score: 23    End of Session Equipment Utilized During Treatment: Gait belt Activity Tolerance: Patient tolerated treatment well Patient left: in chair;with family/visitor present;with nursing/sitter in room Nurse Communication: Mobility status PT Visit Diagnosis: Difficulty in walking, not elsewhere classified (R26.2)    Time: 9371-6967 PT Time Calculation (min) (ACUTE ONLY): 26 min   Charges:   PT Evaluation $PT Eval Moderate Complexity: 1 Mod PT Treatments $Gait Training: 8-22 mins       D. Royetta Asal PT, DPT 01/03/22, 11:56 AM

## 2022-01-03 NOTE — Anesthesia Preprocedure Evaluation (Addendum)
Anesthesia Evaluation  Patient identified by MRN, date of birth, ID band Patient awake    Reviewed: Allergy & Precautions, NPO status , Patient's Chart, lab work & pertinent test results  History of Anesthesia Complications (+) history of anesthetic complications (reports pneumonia after last surgery. questionable aspiration cause)  Airway Mallampati: III  TM Distance: >3 FB Neck ROM: full    Dental no notable dental hx.    Pulmonary neg pulmonary ROS,    Pulmonary exam normal        Cardiovascular negative cardio ROS Normal cardiovascular exam     Neuro/Psych PSYCHIATRIC DISORDERS Anxiety negative neurological ROS     GI/Hepatic Neg liver ROS, GERD  Medicated and Controlled,  Endo/Other  negative endocrine ROS  Renal/GU negative Renal ROS  negative genitourinary   Musculoskeletal   Abdominal (+) + obese,   Peds  Hematology negative hematology ROS (+)   Anesthesia Other Findings Past Medical History: No date: Anxiety No date: Complication of anesthesia     Comment:  patient developed pneumonia after last surgery. unsure               if she aspirated during surgery. No date: GERD (gastroesophageal reflux disease) 03/2017: Hematometra No date: Pneumonia  Past Surgical History: No date: ABDOMINAL HYSTERECTOMY 2014: CERVICAL CONE BIOPSY No date: CESAREAN SECTION     Comment:  x 2 2013: COLPOSCOPY 05/01/2017: LAPAROSCOPIC HYSTERECTOMY; N/A     Comment:  Procedure: HYSTERECTOMY TOTAL LAPAROSCOPIC;  Surgeon:               Ward, Honor Loh, MD;  Location: [facility] ORS;  Service:               Gynecology;  Laterality: N/A; 05/01/2017: LAPAROSCOPIC SALPINGO OOPHERECTOMY; Right     Comment:  Procedure: LAPAROSCOPIC SALPINGO OOPHORECTOMY;  Surgeon:              Ward, Honor Loh, MD;  Location: [facility] ORS;  Service:               Gynecology;  Laterality: Right; 05/01/2017: LAPAROSCOPIC UNILATERAL SALPINGECTOMY; Left      Comment:  Procedure: LAPAROSCOPIC UNILATERAL SALPINGECTOMY;                Surgeon: Ward, Honor Loh, MD;  Location: [facility] ORS;                Service: Gynecology;  Laterality: Left; No date: OOPHORECTOMY; Left No date: WISDOM TOOTH EXTRACTION  BMI    Body Mass Index: 35.58 kg/m      Reproductive/Obstetrics negative OB ROS                            Anesthesia Physical Anesthesia Plan  ASA: 2  Anesthesia Plan: General   Post-op Pain Management: Regional block*, Toradol IV (intra-op)* and Ofirmev IV (intra-op)*   Induction: Intravenous and Rapid sequence  PONV Risk Score and Plan: 3 and Ondansetron, Dexamethasone and Midazolam  Airway Management Planned: Oral ETT  Additional Equipment:   Intra-op Plan:   Post-operative Plan: Extubation in OR  Informed Consent: I have reviewed the patients History and Physical, chart, labs and discussed the procedure including the risks, benefits and alternatives for the proposed anesthesia with the patient or authorized representative who has indicated his/her understanding and acceptance.     Dental Advisory Given  Plan Discussed with: Anesthesiologist, CRNA and Surgeon  Anesthesia Plan Comments:        Anesthesia  Quick Evaluation

## 2022-01-03 NOTE — Anesthesia Postprocedure Evaluation (Signed)
Anesthesia Post Note  Patient: Julie Sharp  Procedure(s) Performed: CHEILECTOMY (Left: Foot)  Patient location during evaluation: PACU Anesthesia Type: General Level of consciousness: awake and alert Pain management: pain level controlled Vital Signs Assessment: post-procedure vital signs reviewed and stable Respiratory status: spontaneous breathing, nonlabored ventilation and respiratory function stable Cardiovascular status: blood pressure returned to baseline and stable Postop Assessment: no apparent nausea or vomiting Anesthetic complications: no   No notable events documented.   Last Vitals:  Vitals:   01/03/22 0931 01/03/22 1027  BP: (!) 135/95 (!) 134/91  Pulse: 86 95  Resp: 16   Temp: 36.5 C   SpO2: 96% 100%    Last Pain:  Vitals:   01/03/22 1027  TempSrc:   PainSc: 0-No pain                 [place] Ouch

## 2022-01-03 NOTE — Transfer of Care (Signed)
Immediate Anesthesia Transfer of Care Note  Patient: Julie Sharp  Procedure(s) Performed: CHEILECTOMY (Left: Foot)  Patient Location: PACU  Anesthesia Type:General  Level of Consciousness: awake, drowsy and patient cooperative  Airway & Oxygen Therapy: Patient Spontanous Breathing and Patient connected to face mask oxygen  Post-op Assessment: Report given to RN and Post -op Vital signs reviewed and stable  Post vital signs: Reviewed and stable  Last Vitals:  Vitals Value Taken Time  BP 148/90 01/03/22 0847  Temp    Pulse 98 01/03/22 0850  Resp    SpO2 100 % 01/03/22 0850  Vitals shown include unvalidated device data.  Last Pain:  Vitals:   01/03/22 0628  TempSrc: Oral  PainSc: 0-No pain      Patients Stated Pain Goal: 0 (65/78/46 9629)  Complications: No notable events documented.

## 2022-01-03 NOTE — Op Note (Signed)
Date of operation: 01/03/2022.  Surgeon: Durward Fortes D.P.M.  Preoperative diagnosis: Hallux limitus left foot.  Postoperative diagnosis: Same.  Procedure: Cheilectomy left great toe joint.  Anesthesia: Local MAC.  Hemostasis: Esmarch tourniquet left ankle.  Estimated blood loss: Less than 5 cc.  Pathology: None.  Complications: None apparent.  Operative indications: This is a 51 year old female with chronic history of painful left great toe joint with arthritic changes.  Patient elects for surgical correction with cheilectomy.  Operative procedure: Patient was taken to the operating room and placed on the table in the supine position.  Following satisfactory sedation the left foot was anesthetized with 10 cc of 0.5% Marcaine plain around the first metatarsal.  The foot was prepped and draped in the usual sterile fashion.  The foot was exsanguinated using an Esmarch which was then left intact around the ankle to act as a tourniquet.  Attention was directed towards the dorsal aspect of the left great toe joint where a linear incision was made over the distal first metatarsal and joint.  The incision was deepened via sharp and blunt dissection with care taken to cauterize all bleeders and retract neurovascular structures.  Linear capsular incision was made into the joint with capsular and periosteal tissues reflected off of the head of the first metatarsal and base of the proximal phalanx.  Spurring was noted on the dorsal and medial aspect of the first metatarsal head as well as the dorsal aspect of the hallux.  Significant articular cartilage loss at the dorsal half of the first metatarsal head.  Using a sagittal saw the medial eminence and dorsal spur were resected with care taken to remove the damaged cartilage.  Rongeur was used to remove the spurring off the base of the proximal phalanx.  Overall bony edges were rasped smoothed with a power rasp.  Excellent range of motion was noted in the  joint at this point with no crepitus.  The wound was flushed with copious amounts of sterile saline.  Intraoperative FluoroScan views taken which revealed good reduction of the dorsal prominence.  Wound was flushed again with sterile saline and closed using 4-0 Vicryl running suture for all layers from capsular and periosteal closure to deep and superficial subcutaneous as well as skin closure.  Tincture of benzoin and Steri-Strips applied followed by Xeroform and a sterile bandage.  Esmarch bandage was released and blood flow noted return immediately to all digits.  Kerlix and an Ace wrap then applied for compression.  Patient was awakened and transported to the PACU having tolerated the anesthesia and procedures well.

## 2022-01-03 NOTE — H&P (Signed)
Subjective: Patient presents today for surgery for colectomy for arthritic left great toe joint.  Objective: Neurovascular status intact.  Palpable exostosis left first MTPJ with some limitation of dorsiflexion motion.  Assessment: Hallux limitus with hallux valgus.  Plan: Medical history and physical in the chart was reviewed.  No interval change from prior office evaluation.  Patient stable to proceed with surgery for cheilectomy left foot.

## 2022-01-03 NOTE — Discharge Instructions (Addendum)
1.  Elevate the left lower extremity on 2 pillows.  2.  Keep the bandage on the left foot clean, dry, and do not remove.  3.  Sponge bathe only left lower extremity.  4.  Wear surgical shoe on the left foot whenever walking or standing.  5.  Take 1 pain pill, Norco, every 4 hours only if needed for pain.   AMBULATORY SURGERY  DISCHARGE INSTRUCTIONS   The drugs that you were given will stay in your system until tomorrow so for the next 24 hours you should not:  Drive an automobile Make any legal decisions Drink any alcoholic beverage   You may resume regular meals tomorrow.  Today it is better to start with liquids and gradually work up to solid foods.  You may eat anything you prefer, but it is better to start with liquids, then soup and crackers, and gradually work up to solid foods.   Please notify your doctor immediately if you have any unusual bleeding, trouble breathing, redness and pain at the surgery site, drainage, fever, or pain not relieved by medication.     Additional Instructions:

## 2022-01-03 NOTE — Anesthesia Procedure Notes (Signed)

## 2022-05-27 DIAGNOSIS — J40 Bronchitis, not specified as acute or chronic: Secondary | ICD-10-CM | POA: Diagnosis not present

## 2022-05-27 DIAGNOSIS — J45902 Unspecified asthma with status asthmaticus: Secondary | ICD-10-CM | POA: Diagnosis not present

## 2022-06-08 IMAGING — MG MM DIGITAL SCREENING BILAT W/ TOMO AND CAD
6 of 10 series · 6 of 30 positions shown · non-contrast
Comparison: Previous exam(s).

CLINICAL DATA: Screening.

EXAM:
DIGITAL SCREENING BILATERAL MAMMOGRAM WITH TOMOSYNTHESIS AND CAD
TECHNIQUE: Bilateral screening digital craniocaudal and mediolateral oblique
mammograms were obtained. Bilateral screening digital breast
tomosynthesis was performed. The images were evaluated with
computer-aided detection.

[L MLO synth-2D]
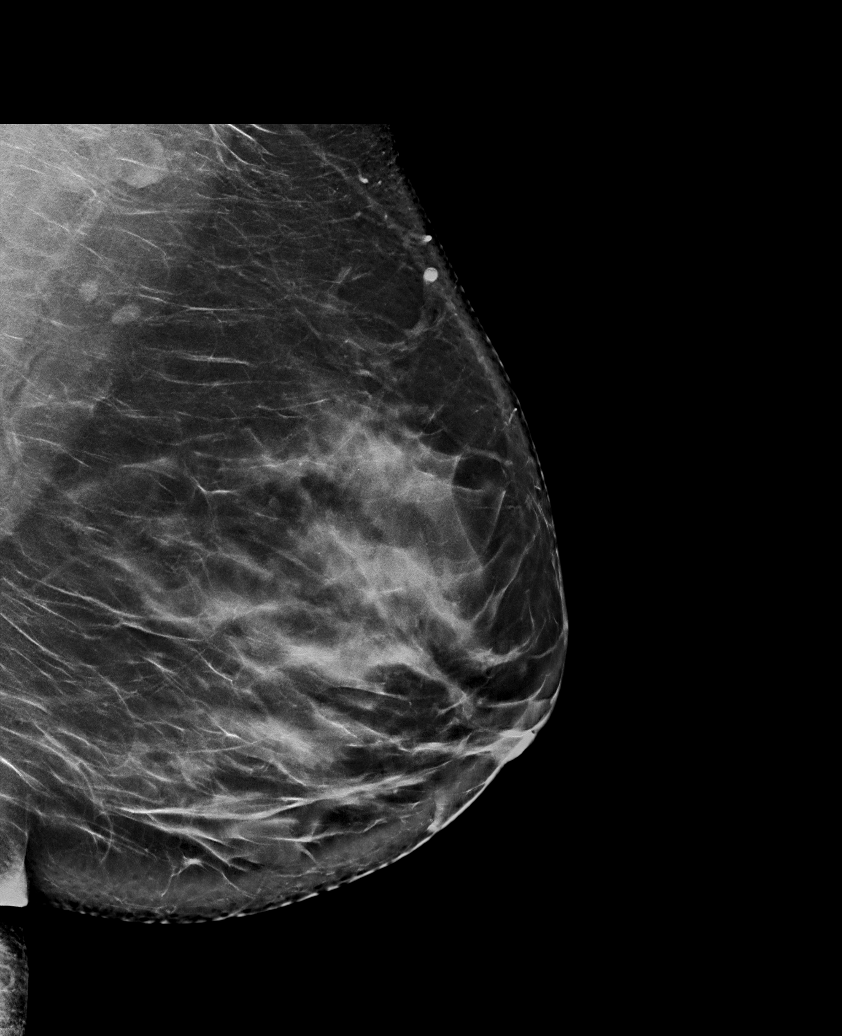

[R MLO synth-2D]
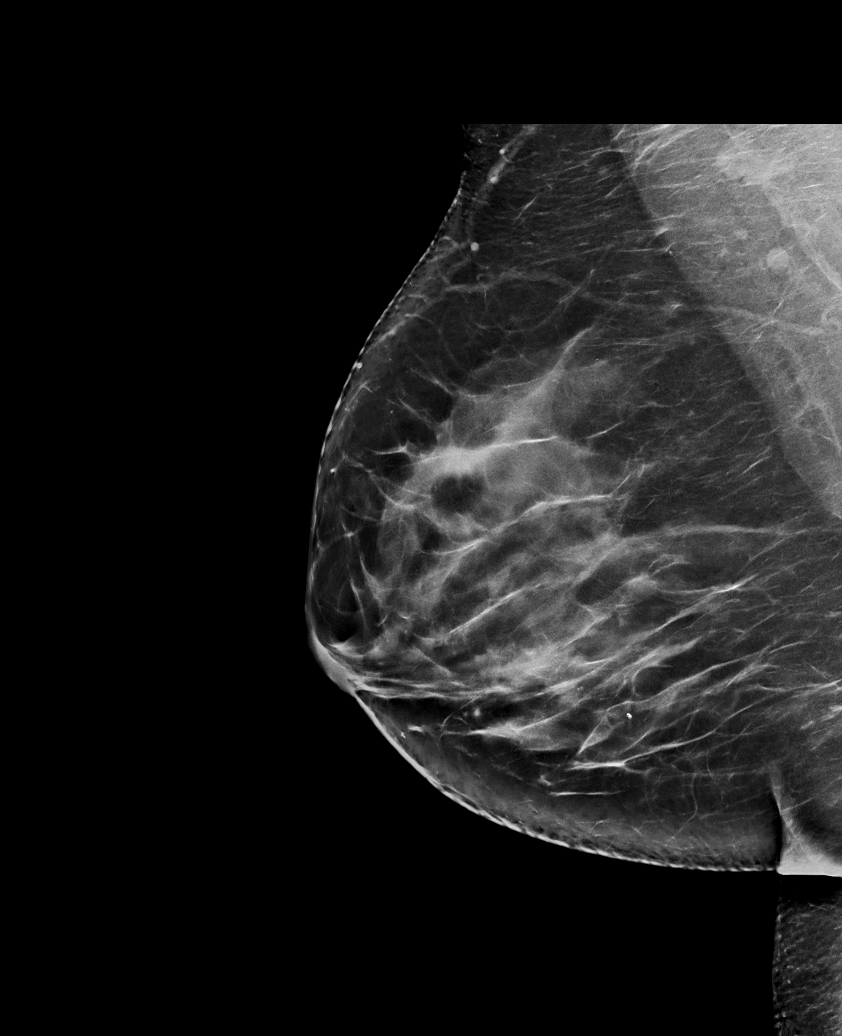

[L CC synth-2D]
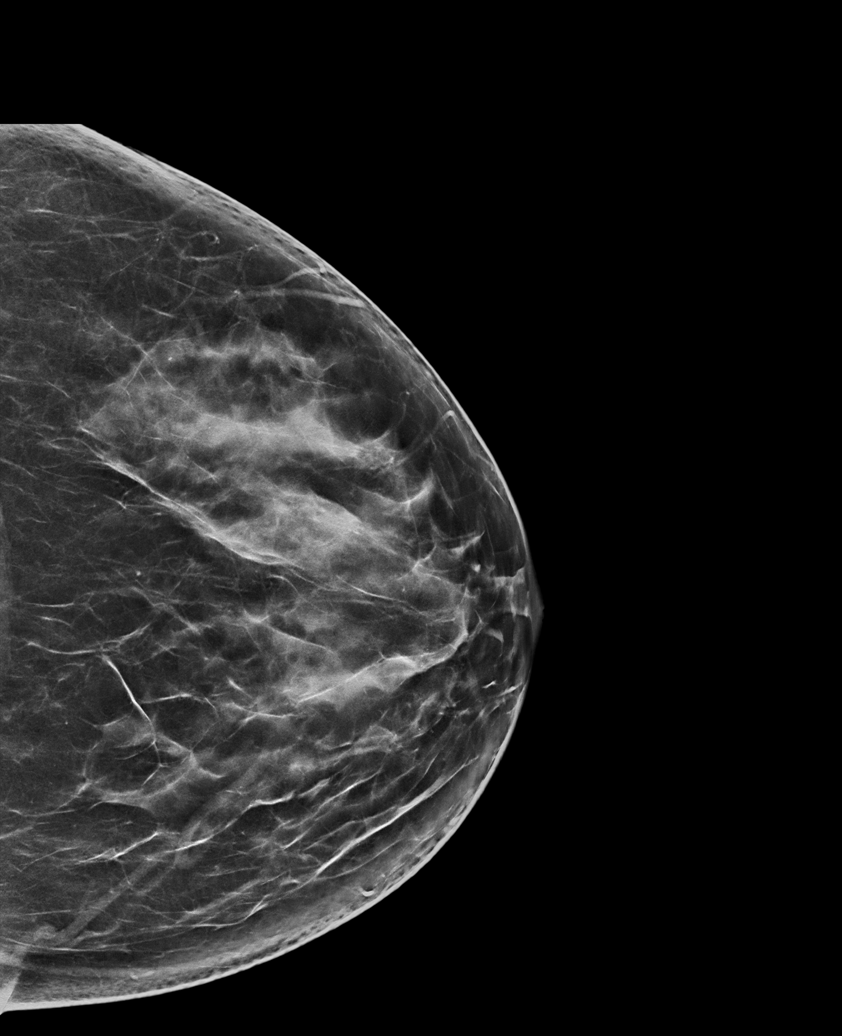

[R CV synth-2D]
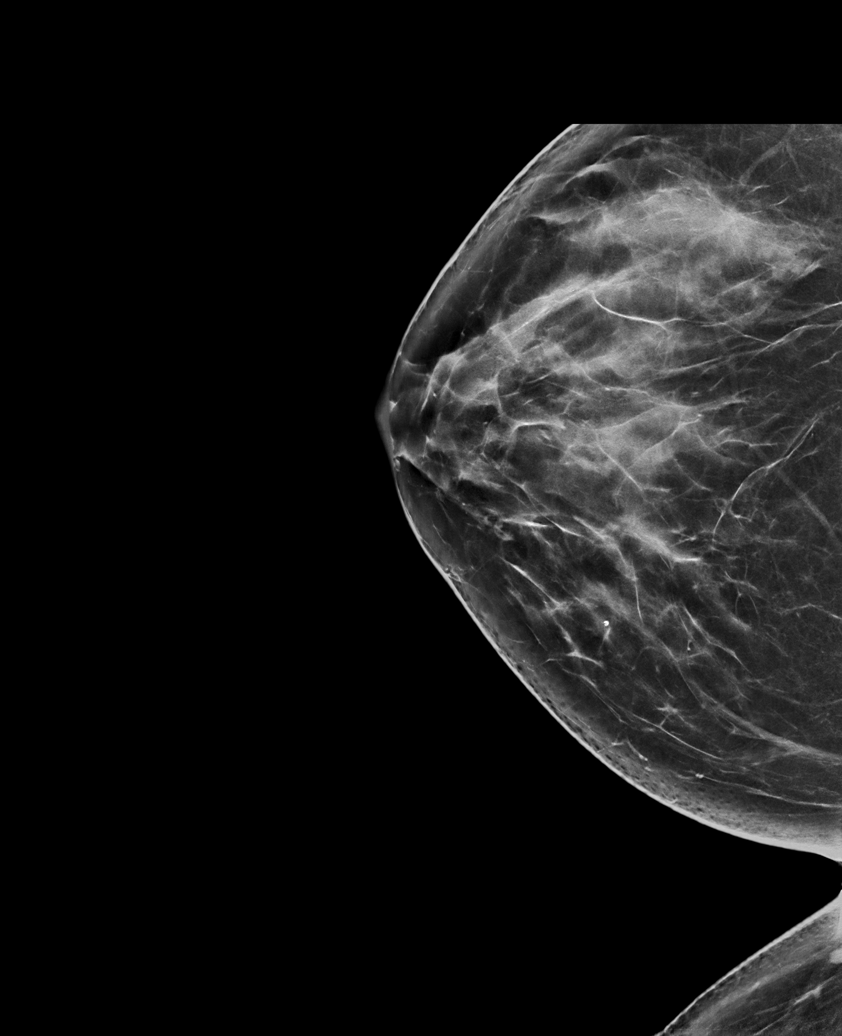

[R CC synth-2D]
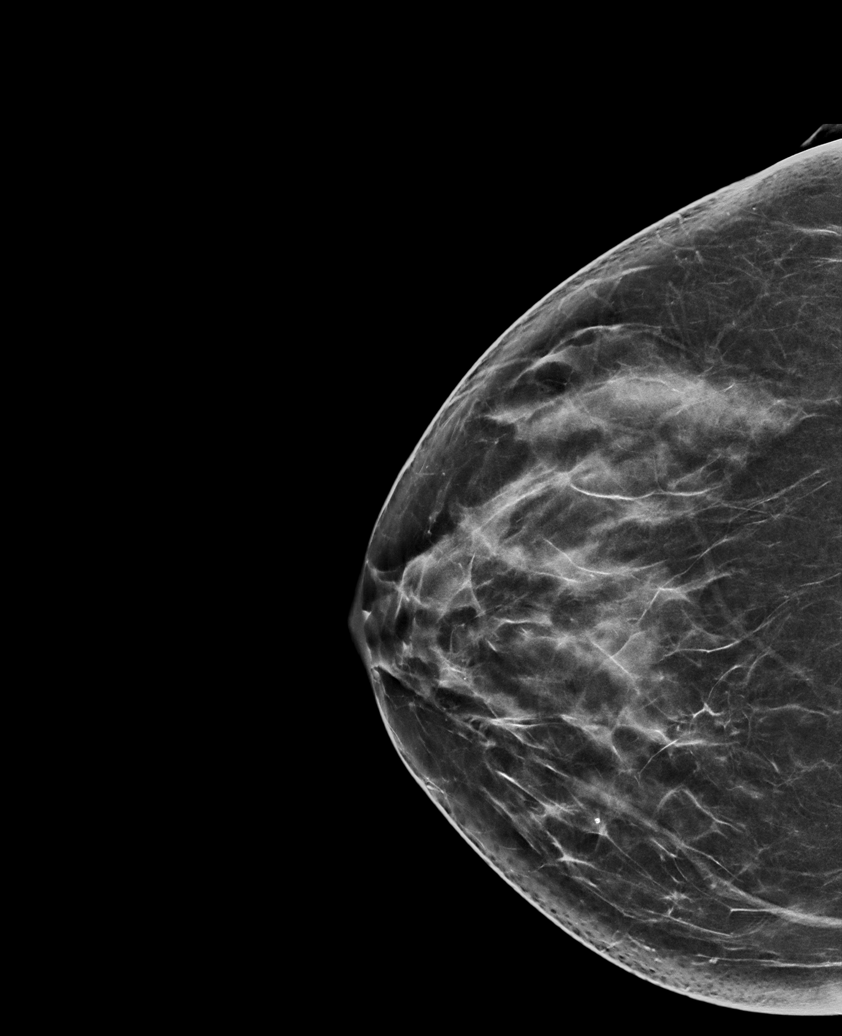

[R CV tomo · tomo slice 43/84.0]
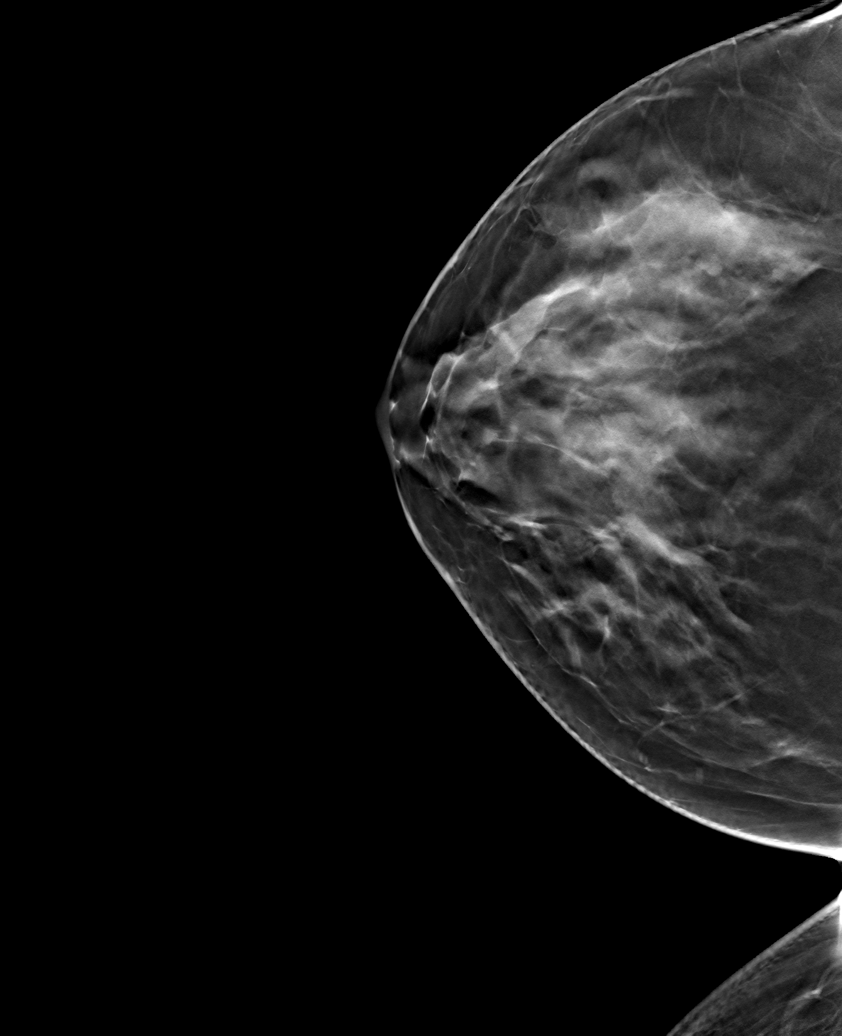

[6 of 30 positions shown; findings below may reference images not displayed]

ACR Breast Density Category c: The breast tissue is heterogeneously
dense, which may obscure small masses.
FINDINGS: There are no findings suspicious for malignancy.
IMPRESSION: No mammographic evidence of malignancy. A result letter of this
screening mammogram will be mailed directly to the patient.

RECOMMENDATION:
Screening mammogram in one year. (Code:Q3-W-BC3)

BI-RADS CATEGORY  1: Negative.

## 2022-08-28 ENCOUNTER — Other Ambulatory Visit: Payer: Self-pay | Admitting: Internal Medicine

## 2022-08-28 DIAGNOSIS — Z1231 Encounter for screening mammogram for malignant neoplasm of breast: Secondary | ICD-10-CM

## 2022-09-01 ENCOUNTER — Ambulatory Visit
Admission: RE | Admit: 2022-09-01 | Discharge: 2022-09-01 | Disposition: A | Discharge status: Home / Self Care | Payer: 59 | Source: Ambulatory Visit | Attending: Internal Medicine | Admitting: Internal Medicine

## 2022-09-01 DIAGNOSIS — Z1231 Encounter for screening mammogram for malignant neoplasm of breast: Secondary | ICD-10-CM | POA: Diagnosis present

## 2023-08-25 ENCOUNTER — Other Ambulatory Visit: Payer: Self-pay | Admitting: Podiatry

## 2023-08-28 ENCOUNTER — Ambulatory Visit
Admission: RE | Admit: 2023-08-28 | Discharge: 2023-08-28 | Disposition: A | Discharge status: Home / Self Care | Attending: Podiatry | Admitting: Podiatry

## 2023-08-28 ENCOUNTER — Ambulatory Visit

## 2023-08-28 ENCOUNTER — Ambulatory Visit: Admitting: Anesthesiology

## 2023-08-28 ENCOUNTER — Other Ambulatory Visit: Payer: Self-pay

## 2023-08-28 ENCOUNTER — Encounter: Payer: Self-pay | Admitting: Podiatry

## 2023-08-28 ENCOUNTER — Encounter
Admission: RE | Disposition: A | Discharge status: Home / Self Care | Payer: Self-pay | Source: Home / Self Care | Attending: Podiatry

## 2023-08-28 DIAGNOSIS — S86012A Strain of left Achilles tendon, initial encounter: Secondary | ICD-10-CM | POA: Insufficient documentation

## 2023-08-28 DIAGNOSIS — X58XXXA Exposure to other specified factors, initial encounter: Secondary | ICD-10-CM | POA: Diagnosis not present

## 2023-08-28 DIAGNOSIS — Z419 Encounter for procedure for purposes other than remedying health state, unspecified: Secondary | ICD-10-CM

## 2023-08-28 DIAGNOSIS — J45909 Unspecified asthma, uncomplicated: Secondary | ICD-10-CM | POA: Diagnosis not present

## 2023-08-28 HISTORY — DX: Unspecified asthma, uncomplicated: J45.909

## 2023-08-28 HISTORY — DX: Gastro-esophageal reflux disease with esophagitis, without bleeding: K21.00

## 2023-08-28 HISTORY — DX: Personal history of other diseases of the respiratory system: Z87.09

## 2023-08-28 HISTORY — DX: Vitamin B12 deficiency anemia due to intrinsic factor deficiency: D51.0

## 2023-08-28 HISTORY — DX: Nausea with vomiting, unspecified: R11.2

## 2023-08-28 HISTORY — PX: ACHILLES TENDON SURGERY: SHX542

## 2023-08-28 SURGERY — REPAIR, TENDON, ACHILLES
Anesthesia: General | Laterality: Left

## 2023-08-28 MED ORDER — SCOPOLAMINE 1 MG/3DAYS TD PT72
1.0000 | MEDICATED_PATCH | TRANSDERMAL | Status: DC
  Administered 2023-08-28: 1 via TRANSDERMAL

## 2023-08-28 MED ORDER — SUCCINYLCHOLINE CHLORIDE 200 MG/10ML IV SOSY
PREFILLED_SYRINGE | INTRAVENOUS | Status: DC | PRN
  Administered 2023-08-28: 120 mg via INTRAVENOUS

## 2023-08-28 MED ORDER — LIDOCAINE HCL (PF) 1 % IJ SOLN
INTRAMUSCULAR | Status: AC
  Filled 2023-08-28: qty 30

## 2023-08-28 MED ORDER — SUGAMMADEX SODIUM 200 MG/2ML IV SOLN
INTRAVENOUS | Status: DC | PRN
  Administered 2023-08-28: 200 mg via INTRAVENOUS

## 2023-08-28 MED ORDER — METOCLOPRAMIDE HCL 10 MG PO TABS: 5.0000 mg | ORAL_TABLET | Freq: Three times a day (TID) | ORAL | Status: DC | PRN

## 2023-08-28 MED ORDER — ONDANSETRON HCL 4 MG/2ML IJ SOLN
INTRAMUSCULAR | Status: AC
  Filled 2023-08-28: qty 2

## 2023-08-28 MED ORDER — BUPIVACAINE-EPINEPHRINE 0.25% -1:200000 IJ SOLN
INTRAMUSCULAR | Status: DC | PRN
  Administered 2023-08-28: 10 mL

## 2023-08-28 MED ORDER — ONDANSETRON HCL 4 MG/2ML IJ SOLN
4.0000 mg | Freq: Four times a day (QID) | INTRAMUSCULAR | Status: DC | PRN
  Administered 2023-08-28: 4 mg via INTRAVENOUS

## 2023-08-28 MED ORDER — BUPIVACAINE HCL (PF) 0.25 % IJ SOLN
INTRAMUSCULAR | Status: DC | PRN
  Administered 2023-08-28: 20 mL via EPIDURAL

## 2023-08-28 MED ORDER — ORAL CARE MOUTH RINSE: 15.0000 mL | Freq: Once | OROMUCOSAL | Status: AC

## 2023-08-28 MED ORDER — LACTATED RINGERS IV SOLN: INTRAVENOUS | Status: DC

## 2023-08-28 MED ORDER — ACETAMINOPHEN 10 MG/ML IV SOLN
INTRAVENOUS | Status: AC
Start: 2023-08-28 — End: ?
  Filled 2023-08-28: qty 100

## 2023-08-28 MED ORDER — SCOPOLAMINE 1 MG/3DAYS TD PT72
MEDICATED_PATCH | TRANSDERMAL | Status: AC
  Filled 2023-08-28: qty 1

## 2023-08-28 MED ORDER — BUPIVACAINE LIPOSOME 1.3 % IJ SUSP
INTRAMUSCULAR | Status: AC
  Filled 2023-08-28: qty 10

## 2023-08-28 MED ORDER — DEXAMETHASONE SODIUM PHOSPHATE 10 MG/ML IJ SOLN
10.0000 mg | Freq: Once | INTRAMUSCULAR | Status: AC
  Administered 2023-08-28 (×2): 10 mg

## 2023-08-28 MED ORDER — BUPIVACAINE LIPOSOME 1.3 % IJ SUSP
10.0000 mL | Freq: Once | INTRAMUSCULAR | Status: AC
  Administered 2023-08-28: 10 mL

## 2023-08-28 MED ORDER — CEFAZOLIN SODIUM-DEXTROSE 2-4 GM/100ML-% IV SOLN
2.0000 g | INTRAVENOUS | Status: AC
  Administered 2023-08-28: 2 g via INTRAVENOUS

## 2023-08-28 MED ORDER — FENTANYL CITRATE PF 50 MCG/ML IJ SOSY
100.0000 ug | PREFILLED_SYRINGE | Freq: Once | INTRAMUSCULAR | Status: AC
  Administered 2023-08-28: 50 ug via INTRAVENOUS

## 2023-08-28 MED ORDER — FENTANYL CITRATE (PF) 100 MCG/2ML IJ SOLN
INTRAMUSCULAR | Status: AC
  Filled 2023-08-28: qty 2

## 2023-08-28 MED ORDER — CHLORHEXIDINE GLUCONATE 0.12 % MT SOLN
15.0000 mL | Freq: Once | OROMUCOSAL | Status: AC
  Administered 2023-08-28: 15 mL via OROMUCOSAL

## 2023-08-28 MED ORDER — 0.9 % SODIUM CHLORIDE (POUR BTL) OPTIME
TOPICAL | Status: DC | PRN
  Administered 2023-08-28: 500 mL

## 2023-08-28 MED ORDER — OXYCODONE HCL 5 MG/5ML PO SOLN: 5.0000 mg | Freq: Once | ORAL | Status: AC | PRN

## 2023-08-28 MED ORDER — CHLORHEXIDINE GLUCONATE 0.12 % MT SOLN
OROMUCOSAL | Status: AC
  Filled 2023-08-28: qty 15

## 2023-08-28 MED ORDER — LIDOCAINE HCL (CARDIAC) PF 100 MG/5ML IV SOSY
PREFILLED_SYRINGE | INTRAVENOUS | Status: DC | PRN
  Administered 2023-08-28: 100 mg via INTRAVENOUS

## 2023-08-28 MED ORDER — ONDANSETRON HCL 4 MG/2ML IJ SOLN
4.0000 mg | Freq: Once | INTRAMUSCULAR | Status: AC
  Administered 2023-08-28: 4 mg via INTRAVENOUS

## 2023-08-28 MED ORDER — BUPIVACAINE HCL (PF) 0.25 % IJ SOLN
30.0000 mL | Freq: Once | INTRAMUSCULAR | Status: AC
  Administered 2023-08-28: 10 mL
  Filled 2023-08-28: qty 30

## 2023-08-28 MED ORDER — DEXAMETHASONE SODIUM PHOSPHATE 10 MG/ML IJ SOLN
INTRAMUSCULAR | Status: AC
Start: 2023-08-28 — End: ?
  Filled 2023-08-28: qty 1

## 2023-08-28 MED ORDER — BUPIVACAINE-EPINEPHRINE (PF) 0.25% -1:200000 IJ SOLN
INTRAMUSCULAR | Status: AC
  Filled 2023-08-28: qty 30

## 2023-08-28 MED ORDER — ONDANSETRON HCL 4 MG/2ML IJ SOLN
INTRAMUSCULAR | Status: AC
Start: 2023-08-28 — End: ?
  Filled 2023-08-28: qty 2

## 2023-08-28 MED ORDER — OXYCODONE-ACETAMINOPHEN 5-325 MG PO TABS: 1.0000 | ORAL_TABLET | Freq: Four times a day (QID) | ORAL | 0 refills | Status: AC | PRN

## 2023-08-28 MED ORDER — BUPIVACAINE HCL (PF) 0.25 % IJ SOLN
INTRAMUSCULAR | Status: AC
  Filled 2023-08-28: qty 60

## 2023-08-28 MED ORDER — ONDANSETRON HCL 4 MG PO TABS: 4.0000 mg | ORAL_TABLET | Freq: Four times a day (QID) | ORAL | Status: DC | PRN

## 2023-08-28 MED ORDER — CEFAZOLIN SODIUM-DEXTROSE 2-4 GM/100ML-% IV SOLN
INTRAVENOUS | Status: AC
  Filled 2023-08-28: qty 100

## 2023-08-28 MED ORDER — FENTANYL CITRATE PF 50 MCG/ML IJ SOSY
PREFILLED_SYRINGE | INTRAMUSCULAR | Status: AC
  Filled 2023-08-28: qty 2

## 2023-08-28 MED ORDER — ACETAMINOPHEN 10 MG/ML IV SOLN
INTRAVENOUS | Status: DC | PRN
  Administered 2023-08-28: 1000 mg via INTRAVENOUS

## 2023-08-28 MED ORDER — ASPIRIN 81 MG PO TBEC: 81.0000 mg | DELAYED_RELEASE_TABLET | Freq: Two times a day (BID) | ORAL | 0 refills | Status: AC

## 2023-08-28 MED ORDER — MIDAZOLAM HCL 2 MG/2ML IJ SOLN
INTRAMUSCULAR | Status: AC
  Filled 2023-08-28: qty 2

## 2023-08-28 MED ORDER — BUPIVACAINE HCL (PF) 0.5 % IJ SOLN
INTRAMUSCULAR | Status: AC
  Filled 2023-08-28: qty 50

## 2023-08-28 MED ORDER — OXYCODONE HCL 5 MG PO TABS
ORAL_TABLET | ORAL | Status: AC
  Filled 2023-08-28: qty 1

## 2023-08-28 MED ORDER — MIDAZOLAM HCL 2 MG/2ML IJ SOLN
1.0000 mg | INTRAMUSCULAR | Status: DC | PRN
  Administered 2023-08-28: 2 mg via INTRAVENOUS

## 2023-08-28 MED ORDER — ROCURONIUM BROMIDE 100 MG/10ML IV SOLN
INTRAVENOUS | Status: DC | PRN
  Administered 2023-08-28: 30 mg via INTRAVENOUS

## 2023-08-28 MED ORDER — LIDOCAINE HCL (PF) 1 % IJ SOLN
INTRAMUSCULAR | Status: AC
  Filled 2023-08-28: qty 2

## 2023-08-28 MED ORDER — FENTANYL CITRATE (PF) 100 MCG/2ML IJ SOLN
INTRAMUSCULAR | Status: DC | PRN
Start: 2023-08-28 — End: 2023-08-28
  Administered 2023-08-28: 100 ug via INTRAVENOUS

## 2023-08-28 MED ORDER — OXYCODONE HCL 5 MG PO TABS
5.0000 mg | ORAL_TABLET | Freq: Once | ORAL | Status: AC | PRN
  Administered 2023-08-28: 5 mg via ORAL

## 2023-08-28 MED ORDER — FENTANYL CITRATE (PF) 100 MCG/2ML IJ SOLN
25.0000 ug | INTRAMUSCULAR | Status: DC | PRN
Start: 2023-08-28 — End: 2023-08-28
  Administered 2023-08-28: 50 ug via INTRAVENOUS

## 2023-08-28 MED ORDER — METOCLOPRAMIDE HCL 5 MG/ML IJ SOLN: 5.0000 mg | Freq: Three times a day (TID) | INTRAMUSCULAR | Status: DC | PRN

## 2023-08-28 MED ORDER — BUPIVACAINE HCL (PF) 0.25 % IJ SOLN
INTRAMUSCULAR | Status: AC
  Filled 2023-08-28: qty 30

## 2023-08-28 MED ORDER — PROPOFOL 10 MG/ML IV BOLUS
INTRAVENOUS | Status: DC | PRN
  Administered 2023-08-28: 180 mg via INTRAVENOUS

## 2023-08-28 SURGICAL SUPPLY — 60 items
ANCH IMPL SPEEDBRIDGE ACHILLES (Anchor) ×2 IMPLANT
ANCHOR IMPL SPEEDBRDG ACHILLES (Anchor) IMPLANT
BENZOIN TINCTURE PRP APPL 2/3 (GAUZE/BANDAGES/DRESSINGS) ×1 IMPLANT
BIT DRILL 4X4.5 FOOTPRINT STR (BIT) ×1 IMPLANT
BLADE SURG 15 STRL LF DISP TIS (BLADE) ×1 IMPLANT
BLADE SURG MINI STRL (BLADE) ×1 IMPLANT
BNDG ELASTIC 4X5.8 VLCR NS LF (GAUZE/BANDAGES/DRESSINGS) ×2 IMPLANT
BNDG ESMARCH 4X12 STRL LF (GAUZE/BANDAGES/DRESSINGS) ×1 IMPLANT
BNDG ESMARCH 6X12 STRL LF (GAUZE/BANDAGES/DRESSINGS) ×1 IMPLANT
BNDG GAUZE DERMACEA FLUFF 4 (GAUZE/BANDAGES/DRESSINGS) ×1 IMPLANT
BNDG STRETCH GAUZE 3IN X12FT (GAUZE/BANDAGES/DRESSINGS) ×1 IMPLANT
DRAPE FLUOR MINI C-ARM 54X84 (DRAPES) ×1 IMPLANT
DRILL 4X4.5 FOOTPRINT STR (BIT) ×1 IMPLANT
DURAPREP 26ML APPLICATOR (WOUND CARE) ×1 IMPLANT
ELECT REM PT RETURN 9FT ADLT (ELECTROSURGICAL) ×1 IMPLANT
ELECTRODE REM PT RTRN 9FT ADLT (ELECTROSURGICAL) ×1 IMPLANT
GAUZE SPONGE 4X4 12PLY STRL (GAUZE/BANDAGES/DRESSINGS) ×1 IMPLANT
GAUZE STRETCH 2X75IN STRL (MISCELLANEOUS) ×1 IMPLANT
GAUZE XEROFORM 1X8 LF (GAUZE/BANDAGES/DRESSINGS) ×1 IMPLANT
GLOVE BIO SURGEON STRL SZ7.5 (GLOVE) ×1 IMPLANT
GLOVE INDICATOR 8.0 STRL GRN (GLOVE) ×1 IMPLANT
GOWN STRL REUS W/ TWL XL LVL3 (GOWN DISPOSABLE) ×2 IMPLANT
HANDLE YANKAUER SUCT BULB TIP (MISCELLANEOUS) ×1 IMPLANT
IV NS 500ML BAXH (IV SOLUTION) ×1 IMPLANT
KIT TURNOVER KIT A (KITS) ×1 IMPLANT
LABEL OR SOLS (LABEL) IMPLANT
MANIFOLD NEPTUNE II (INSTRUMENTS) ×1 IMPLANT
NDL FILTER BLUNT 18X1 1/2 (NEEDLE) ×1 IMPLANT
NDL HYPO 18GX1.5 BLUNT FILL (NEEDLE) ×1 IMPLANT
NDL HYPO 25X1 1.5 SAFETY (NEEDLE) ×3 IMPLANT
NDL SUT 5 .5 CRC TPR PNT MAYO (NEEDLE) ×1 IMPLANT
NEEDLE FILTER BLUNT 18X1 1/2 (NEEDLE) ×1 IMPLANT
NEEDLE HYPO 18GX1.5 BLUNT FILL (NEEDLE) ×1 IMPLANT
NEEDLE HYPO 25X1 1.5 SAFETY (NEEDLE) ×3 IMPLANT
NS IRRIG 500ML POUR BTL (IV SOLUTION) ×1 IMPLANT
PACK EXTREMITY ARMC (MISCELLANEOUS) ×1 IMPLANT
PAD ABD DERMACEA PRESS 5X9 (GAUZE/BANDAGES/DRESSINGS) IMPLANT
PADDING CAST BLEND 4X4 NS (MISCELLANEOUS) IMPLANT
PADDING CAST BLEND 6X4 STRL (MISCELLANEOUS) IMPLANT
RASP SM TEAR CROSS CUT (RASP) IMPLANT
SPLINT CAST 1 STEP 5X30 WHT (MISCELLANEOUS) ×1 IMPLANT
SPLINT PLASTER CAST FAST 5X30 (CAST SUPPLIES) ×1 IMPLANT
SPONGE T-LAP 18X18 ~~LOC~~+RFID (SPONGE) ×1 IMPLANT
STOCKINETTE IMPERVIOUS 9X36 MD (GAUZE/BANDAGES/DRESSINGS) ×1 IMPLANT
STOCKINETTE M/LG 89821 (MISCELLANEOUS) ×1 IMPLANT
STRIP CLOSURE SKIN 1/2X4 (GAUZE/BANDAGES/DRESSINGS) ×1 IMPLANT
SUT ETHILON 2 0 FS 18 (SUTURE) IMPLANT
SUT MNCRL 4-0 27 PS-2 XMFL (SUTURE) ×1 IMPLANT
SUT PDS AB 0 CT1 27 (SUTURE) IMPLANT
SUT ULTRABRAID #2 38 (SUTURE) ×1 IMPLANT
SUT VIC AB 0 SH 27 (SUTURE) ×1 IMPLANT
SUT VIC AB 2-0 SH 27XBRD (SUTURE) ×2 IMPLANT
SUT VIC AB 3-0 SH 27X BRD (SUTURE) ×1 IMPLANT
SUT VICRYL AB 3-0 FS1 BRD 27IN (SUTURE) ×1 IMPLANT
SUTURE MNCRL 4-0 27XMF (SUTURE) ×1 IMPLANT
SYR 10ML LL (SYRINGE) ×2 IMPLANT
SYR 3ML LL SCALE MARK (SYRINGE) ×1 IMPLANT
TRAP FLUID SMOKE EVACUATOR (MISCELLANEOUS) ×1 IMPLANT
WAND TOPAZ MICRO DEBRIDER (MISCELLANEOUS) IMPLANT
WATER STERILE IRR 500ML POUR (IV SOLUTION) ×1 IMPLANT

## 2023-08-28 NOTE — Anesthesia Preprocedure Evaluation (Addendum)
 Anesthesia Evaluation  Patient identified by MRN, date of birth, ID band Patient awake    Reviewed: Allergy & Precautions, H&P , NPO status , Patient's Chart, lab work & pertinent test results  History of Anesthesia Complications (+) history of anesthetic complications  Airway Mallampati: II  TM Distance: >3 FB Neck ROM: Full    Dental no notable dental hx.    Pulmonary asthma , pneumonia   Pulmonary exam normal breath sounds clear to auscultation       Cardiovascular negative cardio ROS Normal cardiovascular exam Rhythm:Regular Rate:Normal     Neuro/Psych   Anxiety     negative neurological ROS  negative psych ROS   GI/Hepatic Neg liver ROS,GERD  ,,  Endo/Other  negative endocrine ROS    Renal/GU negative Renal ROS  negative genitourinary   Musculoskeletal negative musculoskeletal ROS (+)    Abdominal   Peds negative pediatric ROS (+)  Hematology  (+) Blood dyscrasia, anemia   Anesthesia Other Findings Complication of anesthesia--had pneumonia after previous surgery, possible aspiration??? Per hx  Anxiety Hematometra  Pneumonia GERD (gastroesophageal reflux disease) Hx of bronchitis Intrinsic asthma  Chronic reflux esophagitis Pernicious anemia     Reproductive/Obstetrics negative OB ROS                              Anesthesia Physical Anesthesia Plan  ASA: 2  Anesthesia Plan: General ETT   Post-op Pain Management: Regional block*   Induction: Intravenous, Rapid sequence and Cricoid pressure planned  PONV Risk Score and Plan:   Airway Management Planned: Oral ETT  Additional Equipment:   Intra-op Plan:   Post-operative Plan: Extubation in OR  Informed Consent: I have reviewed the patients History and Physical, chart, labs and discussed the procedure including the risks, benefits and alternatives for the proposed anesthesia with the patient or authorized  representative who has indicated his/her understanding and acceptance.     Dental Advisory Given  Plan Discussed with: Anesthesiologist, CRNA and Surgeon  Anesthesia Plan Comments: (Patient consented for risks of anesthesia including but not limited to:  - adverse reactions to medications - damage to eyes, teeth, lips or other oral mucosa - nerve damage due to positioning  - sore throat or hoarseness - Damage to heart, brain, nerves, lungs, other parts of body or loss of life  Patient voiced understanding and assent.)       Anesthesia Quick Evaluation

## 2023-08-28 NOTE — Anesthesia Postprocedure Evaluation (Signed)
 Anesthesia Post Note  Patient: Julie Sharp  Procedure(s) Performed: REPAIR, TENDON, ACHILLES (Left)  Patient location during evaluation: PACU Anesthesia Type: General Level of consciousness: awake and alert Pain management: pain level controlled Vital Signs Assessment: post-procedure vital signs reviewed and stable Respiratory status: spontaneous breathing, nonlabored ventilation, respiratory function stable and patient connected to nasal cannula oxygen Cardiovascular status: blood pressure returned to baseline and stable Postop Assessment: no apparent nausea or vomiting Anesthetic complications: no   No notable events documented.   Last Vitals:  Vitals:   08/28/23 1432 08/28/23 1454  BP: 130/81 (!) 147/99  Pulse: 91 82  Resp: 20 14  Temp: (!) 36.4 C (!) 36.2 C  SpO2: 94% 96%    Last Pain:  Vitals:   08/28/23 1454  TempSrc:   PainSc: 3                  Amante Fomby C Emberly Tomasso

## 2023-08-28 NOTE — H&P (Signed)
 HISTORY AND PHYSICAL INTERVAL NOTE:  08/28/2023  12:19 PM  Julie Sharp  has presented today for surgery, with the diagnosis of Rupture of left Achilles tendon, initial encounter.  The various methods of treatment have been discussed with the patient.  No guarantees were given.  After consideration of risks, benefits and other options for treatment, the patient has consented to surgery.  I have reviewed the patients' chart and labs.     A history and physical examination was performed in my office.  The patient was reexamined.  There have been no changes to this history and physical examination.  Gwyneth Revels A

## 2023-08-28 NOTE — Anesthesia Procedure Notes (Signed)
 Anesthesia Regional Block: Adductor canal block   Pre-Anesthetic Checklist: , timeout performed,  Correct Patient, Correct Site, Correct Laterality,  Correct Procedure, Correct Position, site marked,  Risks and benefits discussed,  Surgical consent,  Pre-op evaluation,  At surgeon's request and post-op pain management  Laterality: Left and Lower  Prep: chloraprep       Needles:  Injection technique: Single-shot  Needle Type: Echogenic Needle     Needle Length: 9cm  Needle Gauge: 21     Additional Needles:   Procedures:,,,, ultrasound used (permanent image in chart),,    Narrative:  Start time: 08/28/2023 12:24 PM End time: 08/28/2023 12:25 PM Injection made incrementally with aspirations every 5 mL.  Performed by: Personally  Anesthesiologist: Marisue Humble, MD  Additional Notes: Functioning IV was confirmed and monitors applied. Ultrasound guidance: relevant anatomy identified, needle position confirmed, local anesthetic spread visualized around nerve(s)., vascular puncture avoided.  Image printed for medical record.  Negative aspiration and no paresthesias; incremental administration of local anesthetic. The patient tolerated the procedure well. Vitals signs recorded in RN notes.

## 2023-08-28 NOTE — Op Note (Signed)
 Operative note   Surgeon:Rashel Okeefe [employer] forces logistics/support/administrative officer: None    Preop diagnosis: Acute Achilles tendon rupture left lower leg    Postop diagnosis: Same    Procedure: 1.  Open repair Achilles tendon rupture with Arthrex PARS system 2.  Intraoperative use of fluoroscopy without assistance of radiologist    EBL: Minimal    Anesthesia:regional and general a popliteal block had been placed in the preoperative holding area.    Hemostasis: 0.25% bupivacaine with epinephrine infiltrated along the incision sites.  A total of 10 cc was used    Specimen: None    Complications: None    Operative indications:Julie Sharp is an 53 y.o. that presents today for surgical intervention.  The risks/benefits/alternatives/complications have been discussed and consent has been given.    Procedure:  Patient was brought into the OR and placed on the operating table in theprone position. After anesthesia was obtained theleft lower extremity was prepped and draped in usual sterile fashion.  Attention was directed to the posterior aspect of the Achilles at the watershed band region.  A palpable defect was noted.  A transverse incision was made from medial to lateral.  Sharp and blunt dissection carried down to the peritenon.  Horizontal peritenon incision was performed.  This time the noted rupture was found.  Hematoma was noted and removed from the surgical field.  The proximal and distal stumps were then noted.  At this time the pars instrument was entered into the superior stump of the Achilles tendon.  Multiple suture tapes were used to pass using the standard technique with a locking stitch.  The suture was then passed into the incision site.  Next 2 small stab incisions were made on the distal medial and lateral aspect of the calcaneus just medial and lateral to the Achilles insertional site on the superior portion of the calcaneus.  Blunt dissection carried down to the calcaneus.  The bone anchors were then  prepared using standard technique.  A banana loop lasso suture passer was used from the small incision sites and the tendons were passed both medial and lateral to the Achilles distal stump.  The foot was held in a plantarflexed position.  With the assistance of fluoroscopy using standard technique the Arthrex bone anchors were used to stabilize the suture into the calcaneus.  Of note there was a little bit of loosening of the medial bone anchor.  At this time a second bone anchor was used and the tendon was then passed within the calcaneus using the standard technique with a bone anchor and excellent stability was noted at this time.  The foot was held in a anatomic plantarflexed position in its relaxed position.  Wounds were flushed with copious amounts of irrigation.  Closure was then performed with a 3-0 Vicryl for the peritenon and subcutaneous tissue and 3-0 nylon for the skin for all areas.  Sterile dressing was applied with bulky padding to the heel.  Patient was placed in a gravity plantarflexed posterior splint.    Patient tolerated the procedure and anesthesia well.  Was transported from the OR to the PACU with all vital signs stable and vascular status intact. To be discharged per routine protocol.  Will follow up in approximately 1 week in the outpatient clinic.

## 2023-08-28 NOTE — Anesthesia Procedure Notes (Signed)
 Anesthesia Regional Block: Popliteal block   Pre-Anesthetic Checklist: , timeout performed,  Correct Patient, Correct Site, Correct Laterality,  Correct Procedure, Correct Position, site marked,  Risks and benefits discussed,  Surgical consent,  Pre-op evaluation,  At surgeon's request and post-op pain management  Laterality: Left and Lower  Prep: chloraprep       Needles:  Injection technique: Single-shot  Needle Type: Echogenic Needle     Needle Length: 9cm  Needle Gauge: 21     Additional Needles:   Procedures:,,,, ultrasound used (permanent image in chart),,    Narrative:  Start time: 08/28/2023 12:19 PM End time: 08/28/2023 12:22 PM Injection made incrementally with aspirations every 5 mL.  Performed by: Personally  Anesthesiologist: Marisue Humble, MD  Additional Notes: Functioning IV was confirmed and monitors applied. Ultrasound guidance: relevant anatomy identified, needle position confirmed, local anesthetic spread visualized around nerve(s)., vascular puncture avoided.  Image printed for medical record.  Negative aspiration and no paresthesias; incremental administration of local anesthetic. The patient tolerated the procedure well. Vitals signs recorded in RN notes.

## 2023-08-28 NOTE — Anesthesia Procedure Notes (Signed)
 Procedure Name: Intubation Date/Time: 08/28/2023 12:41 PM  Performed by: Cheral Bay, CRNAPre-anesthesia Checklist: Patient identified, Emergency Drugs available, Suction available and Patient being monitored Patient Re-evaluated:Patient Re-evaluated prior to induction Oxygen Delivery Method: Circle system utilized Preoxygenation: Pre-oxygenation with 100% oxygen Induction Type: IV induction and Rapid sequence Laryngoscope Size: McGrath and 3 Grade View: Grade I Tube type: Oral Tube size: 7.0 mm Number of attempts: 1 Airway Equipment and Method: Stylet Placement Confirmation: ETT inserted through vocal cords under direct vision, positive ETCO2 and breath sounds checked- equal and bilateral Secured at: 21 cm Tube secured with: Tape Dental Injury: Teeth and Oropharynx as per pre-operative assessment

## 2023-08-28 NOTE — Transfer of Care (Signed)
 Immediate Anesthesia Transfer of Care Note  Patient: Julie Sharp  Procedure(s) Performed: REPAIR, TENDON, ACHILLES (Left)  Patient Location: PACU  Anesthesia Type:General  Level of Consciousness: awake  Airway & Oxygen Therapy: Patient Spontanous Breathing  Post-op Assessment: Report given to RN and Post -op Vital signs reviewed and stable  Post vital signs: Reviewed and stable  Last Vitals:  Vitals Value Taken Time  BP 130/81 08/28/23 1431  Temp    Pulse 88 08/28/23 1433  Resp 18 08/28/23 1433  SpO2 94 % 08/28/23 1433  Vitals shown include unfiled device data.  Last Pain:  Vitals:   08/28/23 1104  TempSrc: Temporal  PainSc: 1       Patients Stated Pain Goal: 0 (08/28/23 1104)  Complications: No notable events documented.

## 2023-08-28 NOTE — Discharge Instructions (Addendum)
[name]  POST OPERATIVE INSTRUCTIONS FOR DR. Ether Griffins AND DR. BAKER [facility]   Take your medication as prescribed.  Pain medication should be taken only as needed.  Keep the dressing clean, dry and intact.  Keep your foot elevated above the heart level for the first 48 hours.  We have instructed you to be non-weight bearing.  Always wear your post-op shoe when walking.  Always use your crutches if you are to be non-weight bearing.  Do not take a shower. Baths are permissible as long as the foot is kept out of the water.   Every hour you are awake:  Bend your knee 15 times.   Call [name] [phone]) if any of the following problems occur: You develop a temperature or fever. The bandage becomes saturated with blood. Medication does not stop your pain. Injury of the foot occurs. Any symptoms of infection including redness, odor, or red streaks running from wound.

## 2023-08-31 ENCOUNTER — Encounter: Payer: Self-pay | Admitting: Podiatry

## 2023-10-05 ENCOUNTER — Other Ambulatory Visit: Payer: Self-pay | Admitting: Internal Medicine

## 2023-10-05 DIAGNOSIS — Z1231 Encounter for screening mammogram for malignant neoplasm of breast: Secondary | ICD-10-CM

## 2023-10-14 ENCOUNTER — Ambulatory Visit
Admission: RE | Admit: 2023-10-14 | Discharge: 2023-10-14 | Disposition: A | Discharge status: Home / Self Care | Source: Ambulatory Visit | Attending: Internal Medicine | Admitting: Internal Medicine

## 2023-10-14 DIAGNOSIS — Z1231 Encounter for screening mammogram for malignant neoplasm of breast: Secondary | ICD-10-CM | POA: Diagnosis present
